# Patient Record
Sex: Female | Born: 1995 | Race: White | Hispanic: No | Smoking: Never smoker
Health system: Southern US, Community
[De-identification: ages and names within clinical notes are randomized; demographics above are authoritative.]

## PROBLEM LIST (undated history)

## (undated) DIAGNOSIS — Z789 Other specified health status: Secondary | ICD-10-CM

## (undated) DIAGNOSIS — O039 Complete or unspecified spontaneous abortion without complication: Secondary | ICD-10-CM

## (undated) HISTORY — DX: Complete or unspecified spontaneous abortion without complication: O03.9

## (undated) HISTORY — PX: INDUCED ABORTION: SHX677

---
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
## 2017-10-18 ENCOUNTER — Other Ambulatory Visit: Payer: Self-pay

## 2017-10-18 ENCOUNTER — Encounter: Payer: Self-pay | Admitting: Emergency Medicine

## 2017-10-18 ENCOUNTER — Ambulatory Visit
Admission: EM | Admit: 2017-10-18 | Discharge: 2017-10-18 | Disposition: A | Discharge status: Home / Self Care | Payer: Self-pay | Attending: Family Medicine | Admitting: Family Medicine

## 2017-10-18 DIAGNOSIS — R109 Unspecified abdominal pain: Secondary | ICD-10-CM

## 2017-10-18 LAB — WET PREP, GENITAL
Clue Cells Wet Prep HPF POC: NONE SEEN
Sperm: NONE SEEN
TRICH WET PREP: NONE SEEN
YEAST WET PREP: NONE SEEN

## 2017-10-18 LAB — URINALYSIS, COMPLETE (UACMP) WITH MICROSCOPIC
BILIRUBIN URINE: NEGATIVE
Bacteria, UA: NONE SEEN
Bilirubin Urine: NEGATIVE
Glucose, UA: NEGATIVE mg/dL
Glucose, UA: NEGATIVE mg/dL
KETONES UR: 15 mg/dL — AB
Ketones, ur: NEGATIVE mg/dL
NITRITE: NEGATIVE
Nitrite: NEGATIVE
Protein, ur: 30 mg/dL — AB
Protein, ur: NEGATIVE mg/dL
Specific Gravity, Urine: 1.02 (ref 1.005–1.030)
Specific Gravity, Urine: <1.005 — ABNORMAL LOW (ref 1.005–1.030)
pH: 6 (ref 5.0–8.0)
pH: 7 (ref 5.0–8.0)

## 2017-10-18 MED ORDER — TAMSULOSIN HCL 0.4 MG PO CAPS: 0.4000 mg | ORAL_CAPSULE | Freq: Every morning | ORAL | 0 refills | Status: DC

## 2017-10-18 MED ORDER — KETOROLAC TROMETHAMINE 60 MG/2ML IM SOLN: 60.0000 mg | Freq: Once | INTRAMUSCULAR | Status: DC

## 2017-10-18 MED ORDER — KETOROLAC TROMETHAMINE 60 MG/2ML IM SOLN
60.0000 mg | Freq: Once | INTRAMUSCULAR | Status: AC
  Administered 2017-10-18: 60 mg via INTRAMUSCULAR

## 2017-10-18 MED ORDER — SULFAMETHOXAZOLE-TRIMETHOPRIM 800-160 MG PO TABS: 1.0000 | ORAL_TABLET | Freq: Two times a day (BID) | ORAL | 0 refills | Status: DC

## 2017-10-18 NOTE — ED Triage Notes (Addendum)
Patient in today c/o 2 day history of urinary frequency, right sided flank pain and malodorous urine. Patient had temp of 99.7 yesterday. Patient does state that she does have some vaginal discharge but that is normal for her.  Patient also complaining of heart burn.

## 2017-10-18 NOTE — ED Provider Notes (Addendum)
[facility]    CSN: [id number] Arrival date & time: 10/18/17  1851     History   Chief Complaint Chief Complaint  Patient presents with  . Urinary Frequency  . Flank Pain    HPI Audrey Cruz is a 22 y.o. female.   HPI  22 year old female presents today with a 2 to 3-day history of urinary frequency urgency and and insatiety but without dysuria.  She also has noticed a right-sided flank pain and malodorous urine.  Yesterday she had a fever of 99.7.  Today she is afebrile but her pulse rate is 114.  Has complained of shortness of breath her O2 sats are 99%.  States that it hurts to cough and she is trying to just "clear her throat."  Has taken no long car trips.  She does not use birth control pills.  Has never smoked.        History reviewed. No pertinent past medical history.  There are no active problems to display for this patient.   History reviewed. No pertinent surgical history.  OB History   None      Home Medications    Prior to Admission medications   Medication Sig Start Date End Date Taking? Authorizing Provider  sulfamethoxazole-trimethoprim (BACTRIM DS,SEPTRA DS) 800-160 MG tablet Take 1 tablet by mouth 2 (two) times daily. 10/18/17   Lutricia Feil, PA-C  tamsulosin (FLOMAX) 0.4 MG CAPS capsule Take 1 capsule (0.4 mg total) by mouth every morning. 10/18/17   Lutricia Feil, PA-C    Family History Family History  Problem Relation Age of Onset  . Thyroid disease Mother   . Hepatitis C Mother   . Healthy Father     Social History Social History   Tobacco Use  . Smoking status: Never Smoker  . Smokeless tobacco: Never Used  Substance Use Topics  . Alcohol use: No  . Drug use: Yes    Types: Marijuana    Comment: last used ~ 1 month ago     Allergies   Amoxicillin and Penicillins   Review of Systems Review of Systems  Constitutional: Positive for activity change and chills. Negative for fatigue and fever.    Respiratory: Positive for shortness of breath.   Cardiovascular: Positive for chest pain.  Genitourinary: Positive for flank pain, frequency and urgency. Negative for dysuria.  All other systems reviewed and are negative.    Physical Exam Triage Vital Signs ED Triage Vitals  Enc Vitals Group     BP 10/18/17 1900 121/76     Pulse Rate 10/18/17 1900 (!) 114     Resp 10/18/17 1900 16     Temp 10/18/17 1900 98.7 F (37.1 C)     Temp Source 10/18/17 1900 Oral     SpO2 10/18/17 1900 99 %     Weight 10/18/17 1901 135 lb (61.2 kg)     Height 10/18/17 1901 5\' 4"  (1.626 m)     Head Circumference --      Peak Flow --      Pain Score 10/18/17 1901 7     Pain Loc --      Pain Edu? --      Excl. in GC? --    No data found.  Updated Vital Signs BP 121/76 (BP Location: Left Arm)   Pulse (!) 114   Temp 98.7 F (37.1 C) (Oral)   Resp 16   Ht 5\' 4"  (1.626 m)   Wt 135 lb (61.2  kg)   LMP 09/27/2017 (Approximate)   SpO2 99%   BMI 23.17 kg/m   Visual Acuity Right Eye Distance:   Left Eye Distance:   Bilateral Distance:    Right Eye Near:   Left Eye Near:    Bilateral Near:     Physical Exam  Constitutional: She is oriented to person, place, and time. She appears well-developed and well-nourished. No distress.  HENT:  Head: Normocephalic.  Eyes: Pupils are equal, round, and reactive to light. Right eye exhibits no discharge. Left eye exhibits no discharge.  Neck: Normal range of motion.  Pulmonary/Chest: Effort normal and breath sounds normal.  Abdominal: Soft. Bowel sounds are normal.  CVA tenderness on the right  Musculoskeletal: Normal range of motion. She exhibits no tenderness.  She has no tenderness to palpation of the ribs or of the paraspinous muscles in the lower thoracic and lumbar regions.  No calf warmth redness or tenderness.  There is no swelling present.  Neurological: She is alert and oriented to person, place, and time.  Skin: Skin is warm and dry. She is not  diaphoretic.  Psychiatric: She has a normal mood and affect. Her behavior is normal. Judgment and thought content normal.  Nursing note and vitals reviewed.    UC Treatments / Results  Labs (all labs ordered are listed, but only abnormal results are displayed) Labs Reviewed  WET PREP, GENITAL - Abnormal; Notable for the following components:      Result Value   WBC, Wet Prep HPF POC FEW (*)    All other components within normal limits  URINALYSIS, COMPLETE (UACMP) WITH MICROSCOPIC - Abnormal; Notable for the following components:   APPearance CLOUDY (*)    Hgb urine dipstick MODERATE (*)    Ketones, ur 15 (*)    Protein, ur 30 (*)    Leukocytes, UA SMALL (*)    Bacteria, UA MANY (*)    All other components within normal limits  URINALYSIS, COMPLETE (UACMP) WITH MICROSCOPIC - Abnormal; Notable for the following components:   Specific Gravity, Urine <1.005 (*)    Hgb urine dipstick SMALL (*)    Leukocytes, UA TRACE (*)    All other components within normal limits  URINE CULTURE    EKG None  Radiology No results found.  Procedures Patient was unable to give [place] an adequate urine sample after 2 attempts and repeated instructions.  Samples were extremely dirty.  Because of this is a in and out catheterization was felt to be her best option.  The procedure was presented to the patient in detail.  After informed consent, in and out catheterization was achieved without difficulty  Medications Ordered in UC Medications  ketorolac (TORADOL) injection 60 mg (60 mg Intramuscular Given 10/18/17 2026)    Initial Impression / Assessment and Plan / UC Course  I have reviewed the triage vital signs and the nursing notes.  Pertinent labs & imaging results that were available during my care of the patient were reviewed by me and considered in my medical decision making (see chart for details).     Urine sample after catheterization did not show significant white cells but did show red  blood cells .  Having a small amount of red blood cells which was suggestive of a possible kidney stone.  Fits her pain pattern and presentation today as well as physical exam.  We will place her on Septra DS as well as Flomax.  Did receive a injection of Toradol for pain.  Home I recommended that she take ibuprofen 400 mg along with Tylenol 500 mg for pain control.  She worsens she should go immediately to the emergency room.  So if she is not improving in a couple of days she should also go to the emergency room or return to our clinic. Final Clinical Impressions(s) / UC Diagnoses   Final diagnoses:  Right flank pain     Discharge Instructions     Plenty of fluids.  Strain all of your urine for stone.  If your pain worsens go to the emergency room.  If you are not improved in 2 to 3 days you should follow-up with your primary care physician our clinic or go to the emergency room.  Pain recommend 400 mg of ibuprofen combined with Tylenol 500 mg every 6 hours as necessary    ED Prescriptions    Medication Sig Dispense Auth. Provider   sulfamethoxazole-trimethoprim (BACTRIM DS,SEPTRA DS) 800-160 MG tablet Take 1 tablet by mouth 2 (two) times daily. 14 tablet Ovid Curd P, PA-C   tamsulosin (FLOMAX) 0.4 MG CAPS capsule Take 1 capsule (0.4 mg total) by mouth every morning. 30 capsule Lutricia Feil, PA-C     Controlled Substance Prescriptions [place] Controlled Substance Registry consulted? Not Applicable   Lutricia Feil, PA-C 10/18/17 2037    Lutricia Feil, PA-C 10/18/17 2042

## 2017-10-18 NOTE — Discharge Instructions (Signed)
Plenty of fluids.  Strain all of your urine for stone.  If your pain worsens go to the emergency room.  If you are not improved in 2 to 3 days you should follow-up with your primary care physician our clinic or go to the emergency room.  Pain recommend 400 mg of ibuprofen combined with Tylenol 500 mg every 6 hours as necessary

## 2017-10-20 LAB — URINE CULTURE: Culture: <10000 — AB

## 2018-02-08 ENCOUNTER — Other Ambulatory Visit: Payer: Self-pay

## 2018-02-08 ENCOUNTER — Encounter: Payer: Self-pay | Admitting: Emergency Medicine

## 2018-02-08 ENCOUNTER — Ambulatory Visit
Admission: EM | Admit: 2018-02-08 | Discharge: 2018-02-08 | Disposition: A | Discharge status: Home / Self Care | Payer: Self-pay | Attending: Family Medicine | Admitting: Family Medicine

## 2018-02-08 ENCOUNTER — Ambulatory Visit (INDEPENDENT_AMBULATORY_CARE_PROVIDER_SITE_OTHER): Payer: Self-pay

## 2018-02-08 DIAGNOSIS — J069 Acute upper respiratory infection, unspecified: Secondary | ICD-10-CM

## 2018-02-08 DIAGNOSIS — F17290 Nicotine dependence, other tobacco product, uncomplicated: Secondary | ICD-10-CM

## 2018-02-08 DIAGNOSIS — B9789 Other viral agents as the cause of diseases classified elsewhere: Secondary | ICD-10-CM

## 2018-02-08 DIAGNOSIS — R0989 Other specified symptoms and signs involving the circulatory and respiratory systems: Secondary | ICD-10-CM

## 2018-02-08 DIAGNOSIS — R05 Cough: Secondary | ICD-10-CM

## 2018-02-08 MED ORDER — HYDROCOD POLST-CPM POLST ER 10-8 MG/5ML PO SUER: 5.0000 mL | Freq: Every evening | ORAL | 0 refills | Status: DC | PRN

## 2018-02-08 MED ORDER — BENZONATATE 100 MG PO CAPS: 100.0000 mg | ORAL_CAPSULE | Freq: Three times a day (TID) | ORAL | 0 refills | Status: DC | PRN

## 2018-02-08 NOTE — ED Triage Notes (Signed)
Patient c/o cough and chest congestion and runny nose for the past 2 days.

## 2018-02-08 NOTE — ED Provider Notes (Addendum)
[facility] ____________________________________________  Time seen: Approximately 7:46 PM  I have reviewed the triage vital signs and the nursing notes.   HISTORY  Chief Complaint Cough and Nasal Congestion  HPI Audrey Cruz is a 23 y.o. female presenting for evaluation of cough and chest congestion for the last 5 days.  States cough is mostly nonproductive, occasionally productive of whitish phlegm.  No hemoptysis.  States nasal drainage.  Denies sore throat.  States tried multiple over-the-counter cough and congestion agents without resolution.  Denies fever, body aches or chills.  Denies shortness of breath.  Does report she has had occasional soreness in her chest. Denies any chest pain at this time. Has overall continue remain active.  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  Denies home sick contacts. Has continued to overall eat and drink well. Denies other aggravating alleviating factors.  Denies immobilization.  Mickie Bail, MD: PCP Patient's last menstrual period was 01/18/2018 (approximate).  Denies pregnancy   History reviewed. No pertinent past medical history.  There are no active problems to display for this patient.   History reviewed. No pertinent surgical history.   No current facility-administered medications for this encounter.   Current Outpatient Medications:  .  benzonatate (TESSALON PERLES) 100 MG capsule, Take 1 capsule (100 mg total) by mouth 3 (three) times daily as needed for cough., Disp: 15 capsule, Rfl: 0 .  chlorpheniramine-HYDROcodone (TUSSIONEX PENNKINETIC ER) 10-8 MG/5ML SUER, Take 5 mLs by mouth at bedtime as needed for cough. do not drive or operate machinery while taking as can cause drowsiness., Disp: 50 mL, Rfl: 0  Allergies Amoxicillin and Penicillins  Family History  Problem Relation Age of Onset  . Thyroid disease Mother   . Hepatitis C Mother   . Healthy Father      Social History Social History   Tobacco Use  . Smoking status: Never Smoker  . Smokeless tobacco: Never Used  Substance Use Topics  . Alcohol use: No  . Drug use: Yes    Types: Marijuana    Comment: last used ~ 1 month ago    Review of Systems Constitutional: No fever/chills ENT: No sore throat.  Positive nasal congestion. Cardiovascular: Denies current chest pain. Respiratory: Denies shortness of breath. Gastrointestinal: No abdominal pain.  No nausea, no vomiting.  No diarrhea.   Musculoskeletal: Negative for back pain. Skin: Negative for rash.   ____________________________________________   PHYSICAL EXAM:  VITAL SIGNS: ED Triage Vitals  Enc Vitals Group     BP 02/08/18 1807 109/77     Pulse Rate 02/08/18 1807 88     Resp 02/08/18 1807 16     Temp 02/08/18 1807 99 F (37.2 C)     Temp Source 02/08/18 1807 Oral     SpO2 02/08/18 1807 99 %     Weight 02/08/18 1805 135 lb (61.2 kg)     Height 02/08/18 1805 5\' 4"  (1.626 m)     Head Circumference --      Peak Flow --      Pain Score 02/08/18 1805 1     Pain Loc --      Pain Edu? --      Excl. in GC? --    Constitutional: Alert and oriented. Well appearing and in no acute distress. Eyes: Conjunctivae are normal.  Head: Atraumatic. No sinus tenderness to palpation. No swelling. No erythema.  Ears: no erythema, normal TMs  bilaterally.   Nose:Nasal congestion  Mouth/Throat: Mucous membranes are moist. No pharyngeal erythema. No tonsillar swelling or exudate.  Neck: No stridor.  No cervical spine tenderness to palpation. Hematological/Lymphatic/Immunilogical: No cervical lymphadenopathy. Cardiovascular: Normal rate, regular rhythm. Grossly normal heart sounds.  Good peripheral circulation. Respiratory: Normal respiratory effort.  No retractions. No wheezes, rales or rhonchi. Good air movement.  Dry intermittent cough noted in room. Musculoskeletal: Ambulatory with steady gait. No cervical, thoracic or lumbar  tenderness to palpation.  No lower extremity edema noted bilaterally. Neurologic:  Normal speech and language. No gait instability. Skin:  Skin appears warm, dry and intact. No rash noted. Psychiatric: Mood and affect are normal. Speech and behavior are normal. ___________________________________________   LABS (all labs ordered are listed, but only abnormal results are displayed)  Labs Reviewed - No data to display  RADIOLOGY  Dg Chest 2 View  Result Date: 02/08/2018 CLINICAL DATA:  Cough and congestion for 3 days. EXAM: CHEST - 2 VIEW COMPARISON:  None available for comparison at time of study interpretation. FINDINGS: Cardiomediastinal silhouette is normal. No pleural effusions or focal consolidations. Trachea projects midline and there is no pneumothorax. Soft tissue planes and included osseous structures are non-suspicious. IMPRESSION: Normal chest. Electronically Signed   By: Awilda Metroourtnay  Bloomer M.D.   On: 02/08/2018 19:01   ____________________________________________   PROCEDURES Procedures    INITIAL IMPRESSION / ASSESSMENT AND PLAN / ED COURSE  Pertinent labs & imaging results that were available during my care of the patient were reviewed by me and considered in my medical decision making (see chart for details).  Well-appearing patient.  No acute distress.  Suspect viral illness.  With patient's report history of vaping, will evaluate chest x-ray.  Counseled to continue cessation.  Denies any current chest discomfort.  Encourage supportive care.  Chest x-ray as above per radiologist, reviewed by myself, negative.  Will treat supportively with PRN Tessalon Perles and PRN Tussionex, over-the-counter Mucinex.  Rest, fluids.  Work note given for today and tomorrow.  Follow-up with primary care week.Discussed indication, risks and benefits of medications with patient.  Discussed follow up with Primary care physician this week. Discussed follow up and return parameters including no  resolution or any worsening concerns. Patient verbalized understanding and agreed to plan.   ____________________________________________   FINAL CLINICAL IMPRESSION(S) / ED DIAGNOSES  Final diagnoses:  Viral URI with cough     ED Discharge Orders         Ordered    chlorpheniramine-HYDROcodone (TUSSIONEX PENNKINETIC ER) 10-8 MG/5ML SUER  At bedtime PRN     02/08/18 1910    benzonatate (TESSALON PERLES) 100 MG capsule  3 times daily PRN     02/08/18 1910           Note: This dictation was prepared with Dragon dictation along with smaller phrase technology. Any transcriptional errors that result from this process are unintentional.        Renford DillsMiller, Tallie Dodds, NP 02/08/18 1954

## 2018-02-08 NOTE — Discharge Instructions (Addendum)
Take medication as prescribed. Rest. Drink plenty of fluids.  ° °Follow up with your primary care physician this week as needed. Return to Urgent care for new or worsening concerns.  ° °

## 2019-08-04 ENCOUNTER — Emergency Department
Admission: EM | Admit: 2019-08-04 | Discharge: 2019-08-04 | Disposition: A | Discharge status: Home / Self Care | Payer: Self-pay | Attending: Emergency Medicine | Admitting: Emergency Medicine

## 2019-08-04 ENCOUNTER — Encounter: Payer: Self-pay | Admitting: Emergency Medicine

## 2019-08-04 ENCOUNTER — Other Ambulatory Visit: Payer: Self-pay

## 2019-08-04 DIAGNOSIS — R55 Syncope and collapse: Secondary | ICD-10-CM | POA: Insufficient documentation

## 2019-08-04 DIAGNOSIS — Z20822 Contact with and (suspected) exposure to covid-19: Secondary | ICD-10-CM | POA: Insufficient documentation

## 2019-08-04 LAB — CBC
HCT: 38.5 % (ref 36.0–46.0)
Hemoglobin: 13 g/dL (ref 12.0–15.0)
MCH: 29.3 pg (ref 26.0–34.0)
MCHC: 33.8 g/dL (ref 30.0–36.0)
MCV: 86.9 fL (ref 80.0–100.0)
Platelets: 273 10*3/uL (ref 150–400)
RBC: 4.43 MIL/uL (ref 3.87–5.11)
RDW: 13.6 % (ref 11.5–15.5)
WBC: 18.8 10*3/uL — ABNORMAL HIGH (ref 4.0–10.5)
nRBC: 0 % (ref 0.0–0.2)

## 2019-08-04 LAB — URINALYSIS, COMPLETE (UACMP) WITH MICROSCOPIC
Bilirubin Urine: NEGATIVE
Glucose, UA: NEGATIVE mg/dL
Ketones, ur: NEGATIVE mg/dL
Leukocytes,Ua: NEGATIVE
Nitrite: NEGATIVE
Protein, ur: NEGATIVE mg/dL
Specific Gravity, Urine: 1.003 — ABNORMAL LOW (ref 1.005–1.030)
pH: 6 (ref 5.0–8.0)

## 2019-08-04 LAB — BASIC METABOLIC PANEL
Anion gap: 10 (ref 5–15)
BUN: 9 mg/dL (ref 6–20)
CO2: 23 mmol/L (ref 22–32)
Calcium: 9.2 mg/dL (ref 8.9–10.3)
Chloride: 102 mmol/L (ref 98–111)
Creatinine, Ser: 0.58 mg/dL (ref 0.44–1.00)
GFR calc Af Amer: >60 mL/min (ref >60)
GFR calc non Af Amer: >60 mL/min (ref >60)
Glucose, Bld: 87 mg/dL (ref 70–99)
Potassium: 3.1 mmol/L — ABNORMAL LOW (ref 3.5–5.1)
Sodium: 135 mmol/L (ref 135–145)

## 2019-08-04 LAB — SARS CORONAVIRUS 2 BY RT PCR (HOSPITAL ORDER, PERFORMED IN ~~LOC~~ HOSPITAL LAB): SARS Coronavirus 2: NEGATIVE

## 2019-08-04 NOTE — ED Provider Notes (Signed)
[facility] Emergency Department Provider Note  Time seen: 4:50 PM  I have reviewed the triage vital signs and the nursing notes.   HISTORY  Chief Complaint Loss of Consciousness   HPI Audrey Cruz is a 24 y.o. female approximately [redacted] weeks pregnant no other past medical history presents emergency department after syncopal event yesterday.  According to the patient while she was waiting in line to check out at the store she became lightheaded and sweaty.  She called her boyfriend however about had a syncopal episode prior to him getting to her.  Patient states her elbow got stuck in the shopping cart so she never fell to the ground.  She was lowered to the ground was told she was passed out for approximately 30 seconds.  Patient states after waking up she felt a little lightheaded but then felt well.  Today he has been feeling normal but was concerned about her pregnancy so she wanted to come to the emergency department to get checked out.  Denies any chest pain or shortness of breath at any point.  No cough.  Patient does state low-grade fever yesterday but none today and has not taken any antipyretics.   History reviewed. No pertinent past medical history.  There are no problems to display for this patient.   History reviewed. No pertinent surgical history.  Prior to Admission medications   Medication Sig Start Date End Date Taking? Authorizing Provider  benzonatate (TESSALON PERLES) 100 MG capsule Take 1 capsule (100 mg total) by mouth 3 (three) times daily as needed for cough. 02/08/18   Renford Dills, NP  chlorpheniramine-HYDROcodone [facility] ER) 10-8 MG/5ML SUER Take 5 mLs by mouth at bedtime as needed for cough. do not drive or operate machinery while taking as can cause drowsiness. 02/08/18   Renford Dills, NP    Allergies  Allergen Reactions  . Amoxicillin Hives  . Penicillins Hives    Family History  Problem Relation Age of Onset  .  Thyroid disease Mother   . Hepatitis C Mother   . Healthy Father     Social History Social History   Tobacco Use  . Smoking status: Never Smoker  . Smokeless tobacco: Never Used  Vaping Use  . Vaping Use: Former  Substance Use Topics  . Alcohol use: No  . Drug use: Yes    Types: Marijuana    Review of Systems Constitutional: Negative for fever. Cardiovascular: Negative for chest pain. Respiratory: Negative for shortness of breath. Gastrointestinal: Negative for abdominal pain Musculoskeletal: Negative for musculoskeletal complaints Neurological: Negative for headache All other ROS negative  ____________________________________________   PHYSICAL EXAM:  VITAL SIGNS: ED Triage Vitals  Enc Vitals Group     BP 08/04/19 1317 126/71     Pulse Rate 08/04/19 1317 92     Resp 08/04/19 1317 18     Temp 08/04/19 1317 98.9 F (37.2 C)     Temp Source 08/04/19 1317 Oral     SpO2 08/04/19 1317 100 %     Weight 08/04/19 1318 120 lb (54.4 kg)     Height 08/04/19 1318 5\' 4"  (1.626 m)     Head Circumference --      Peak Flow --      Pain Score 08/04/19 1316 2     Pain Loc --      Pain Edu? --      Excl. in GC? --    Constitutional: Alert and oriented. Well appearing and  in no distress. Eyes: Normal exam ENT      Head: Normocephalic and atraumatic.      Mouth/Throat: Mucous membranes are moist. Cardiovascular: Normal rate, regular rhythm. No murmur Respiratory: Normal respiratory effort without tachypnea nor retractions. Breath sounds are clear  Gastrointestinal: Soft and nontender. No distention.   Musculoskeletal: Mild tenderness to palpation over a small bruise to the left upper extremity. Neurologic:  Normal speech and language. No gross focal neurologic deficits Skin:  Skin is warm, dry and intact.  Psychiatric: Mood and affect are normal.   ____________________________________________    EKG  EKG viewed and interpreted by myself shows a normal sinus rhythm at  77 bpm with a narrow QRS, normal axis, largely normal intervals, nonspecific ST changes.  ____________________________________________   INITIAL IMPRESSION / ASSESSMENT AND PLAN / ED COURSE  Pertinent labs & imaging results that were available during my care of the patient were reviewed by me and considered in my medical decision making (see chart for details).   Patient presents emergency department for syncopal event yesterday.  Patient states she feels well today.  No complaints besides some mild left arm discomfort where she hit the shopping cart.  Patient does have a small bruise to the left upper extremity.  Great range of motion in all joints, no hematoma.  Bedside ultrasound shows a fetal heart rate of 165 bpm.  Good fetal movement.  Patient's labs does show a moderate leukocytosis, patient denies any infectious symptoms such as dysuria vaginal bleeding fluid leakage cough or shortness of breath.  Given the patient's report of a low-grade temperature yesterday we will check a Covid swab as a precaution.  Patient agreeable to plan of care.  Otherwise the patient appears well we will discharge home with OB follow-up at [facility].  [name] was evaluated in Emergency Department on 08/04/2019 for the symptoms described in the history of present illness. She was evaluated in the context of the global COVID-19 pandemic, which necessitated consideration that the patient might be at risk for infection with the SARS-CoV-2 virus that causes COVID-19. Institutional protocols and algorithms that pertain to the evaluation of patients at risk for COVID-19 are in a state of rapid change based on information released by regulatory bodies including the CDC and federal and state organizations. These policies and algorithms were followed during the patient's care in the ED.  ____________________________________________   FINAL CLINICAL IMPRESSION(S) / ED DIAGNOSES  Syncopal event   Minna Antis, MD 08/04/19 1654

## 2019-08-04 NOTE — ED Notes (Signed)
See triage note, pt reports syncopal episode while shopping yesterday, [redacted] weeks pregnant. Reports left arm injury that got stuck in shopping cart.  Denies syncopal episodes since yesterday. Pt alert and oriented, NAD noted.

## 2019-08-04 NOTE — ED Notes (Signed)
Signature pad not working, pt verbalizes understanding of d/c instructions and when to return to ER, denies questions/concerns at this time

## 2019-08-04 NOTE — Discharge Instructions (Addendum)
Please drink plenty of fluids, eat small meals throughout the day.  Please follow-up with your OB/GYN to inform them of today's ER visit.  Return to the emergency department for any symptoms personally concerning to yourself.

## 2019-08-04 NOTE — ED Triage Notes (Addendum)
Patient to ER for c/o syncopal episode yesterday while at store. Patient states she got her left arm caught in the cart. Patient states she has had near syncopal episodes a couple of times prior to that. Patient is also [redacted] weeks pregnant. Reports slight fever this am (100.0).

## 2019-09-28 IMAGING — CR DG CHEST 2V
2 series · 2 of 2 positions shown · non-contrast
Comparison: None available for comparison at time of study
interpretation.

CLINICAL DATA: Cough and congestion for 3 days.

EXAM:
CHEST - 2 VIEW

[chest pa]
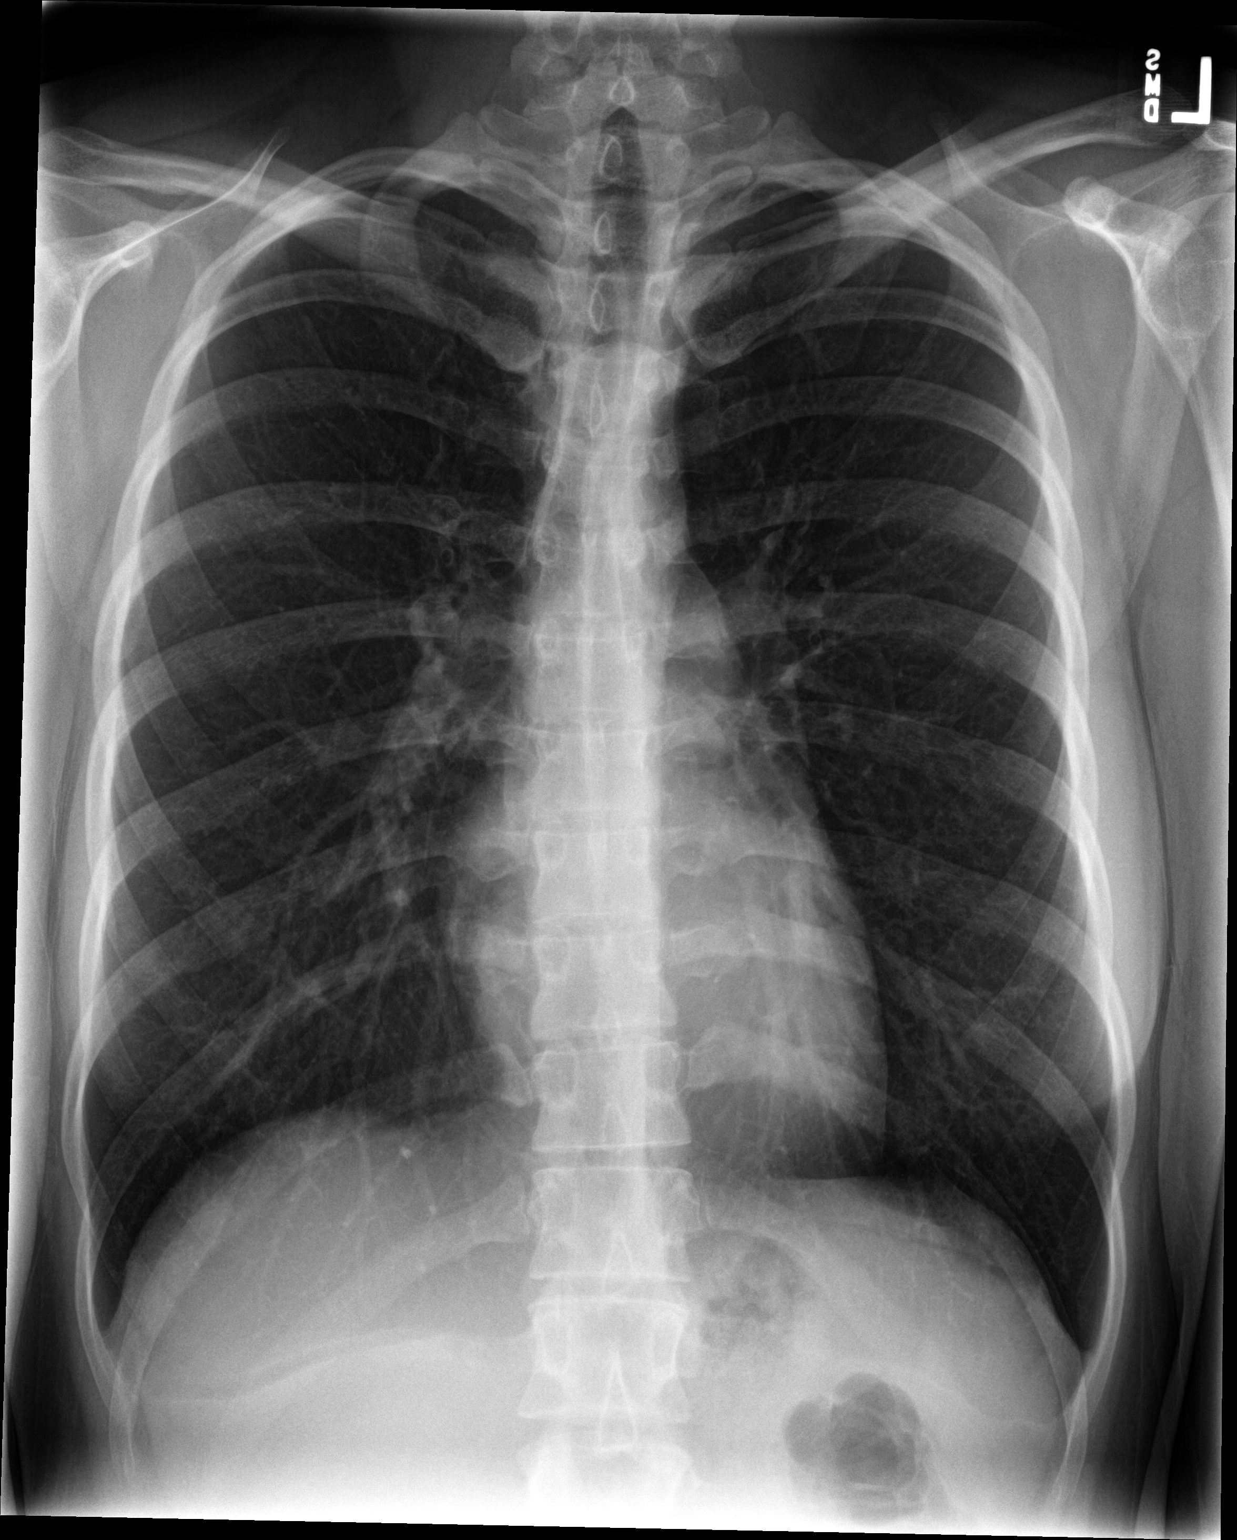

[chest lat]
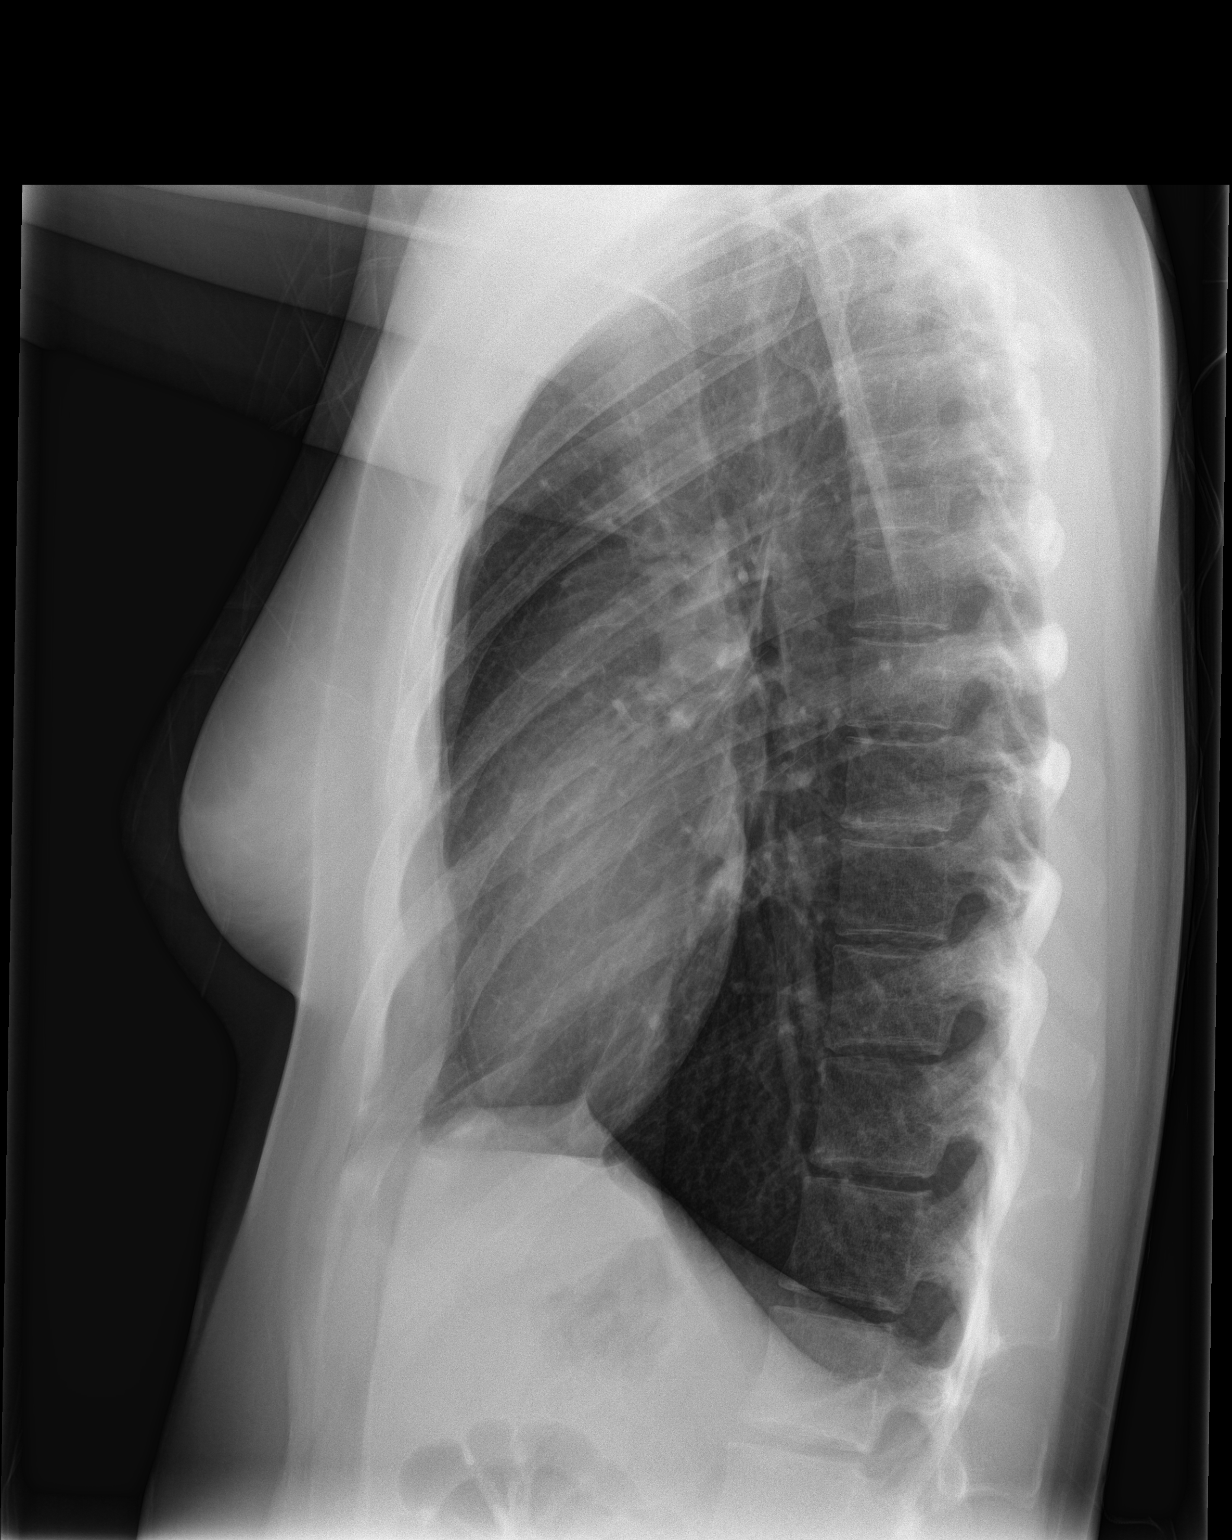

[2 of 2 positions shown; findings below may reference images not displayed]

FINDINGS: Cardiomediastinal silhouette is normal. No pleural effusions or
focal consolidations. Trachea projects midline and there is no
pneumothorax. Soft tissue planes and included osseous structures are
non-suspicious.
IMPRESSION: Normal chest.

## 2019-10-27 ENCOUNTER — Ambulatory Visit
Admission: EM | Admit: 2019-10-27 | Discharge: 2019-10-27 | Disposition: A | Discharge status: Home / Self Care | Payer: Self-pay | Attending: Emergency Medicine | Admitting: Emergency Medicine

## 2019-10-27 ENCOUNTER — Encounter: Payer: Self-pay | Admitting: Emergency Medicine

## 2019-10-27 ENCOUNTER — Other Ambulatory Visit: Payer: Self-pay

## 2019-10-27 DIAGNOSIS — J069 Acute upper respiratory infection, unspecified: Secondary | ICD-10-CM

## 2019-10-27 DIAGNOSIS — Z20822 Contact with and (suspected) exposure to covid-19: Secondary | ICD-10-CM

## 2019-10-27 LAB — GROUP A STREP BY PCR: Group A Strep by PCR: NOT DETECTED

## 2019-10-27 MED ORDER — BENZONATATE 200 MG PO CAPS
200.0000 mg | ORAL_CAPSULE | Freq: Three times a day (TID) | ORAL | 0 refills | Status: DC | PRN
Start: 2019-10-27 — End: 2019-11-12

## 2019-10-27 MED ORDER — FLUTICASONE PROPIONATE 50 MCG/ACT NA SUSP
2.0000 | Freq: Every day | NASAL | 0 refills | Status: DC
Start: 2019-10-27 — End: 2019-11-12

## 2019-10-27 NOTE — ED Provider Notes (Signed)
HPI  SUBJECTIVE:  Audrey Cruz is a 24 y.o. female who presents with 4 days of sore throat, nasal congestion with slight headache, rhinorrhea, mild sinus pressure and cough.  She had a negative Covid PCR test 1 day after the symptoms started.  No fevers, body aches, postnasal drip, loss of sense of smell or taste, shortness of breath, nausea, vomiting, diarrhea, abdominal pain.  No known exposure to strep, Covid, mono, flu.  She did not get the Covid or flu vaccine yet.  Questionable allergy symptoms.  No GERD symptoms.  No drooling, trismus, sensation of throat swelling shut, difficulty breathing, neck stiffness.  No rash.  She is sleeping okay at night without waking up coughing.  No antibiotics in the past month.  No antipyretic in the past 6 hours.  She tried allergy medication which did not help.  She tried Tylenol Sinus and over the counter cough syrup with improvement in her symptoms.  No aggravating factors.  She has a past medical history of seasonal allergies that bother her this time of year.  No history of diabetes, hypertension, pulmonary disease, mono, frequent strep.  LMP: 3 weeks ago.  Denies the possibility of being pregnant.  BUL:AGTXM Nogo, Julious Payer, MD   History reviewed. No pertinent past medical history.  History reviewed. No pertinent surgical history.  Family History  Problem Relation Age of Onset  . Thyroid disease Mother   . Hepatitis C Mother   . Healthy Father     Social History   Tobacco Use  . Smoking status: Never Smoker  . Smokeless tobacco: Never Used  Vaping Use  . Vaping Use: Former  Substance Use Topics  . Alcohol use: No  . Drug use: Yes    Types: Marijuana    No current facility-administered medications for this encounter.  Current Outpatient Medications:  .  benzonatate (TESSALON) 200 MG capsule, Take 1 capsule (200 mg total) by mouth 3 (three) times daily as needed for cough., Disp: 30 capsule, Rfl: 0 .  fluticasone (FLONASE) 50 MCG/ACT nasal  spray, Place 2 sprays into both nostrils daily., Disp: 16 g, Rfl: 0  Allergies  Allergen Reactions  . Amoxicillin Hives  . Penicillins Hives     ROS  As noted in HPI.   Physical Exam  BP 96/68 (BP Location: Right Arm)   Pulse 74   Temp 97.9 F (36.6 C) (Oral)   Resp 18   Ht 5\' 4"  (1.626 m)   Wt 54.4 kg   LMP 10/06/2019   SpO2 100%   Breastfeeding Unknown   BMI 20.60 kg/m   Constitutional: Well developed, well nourished, no acute distress Eyes:  EOMI, conjunctiva normal bilaterally HENT: Normocephalic, atraumatic,mucus membranes moist.  Positive nasal congestion.  No maxillary, frontal sinus tenderness.  Normal oropharynx, normal tonsils with exudates uvula midline.  No cobblestoning, postnasal drip. Neck: No cervical lymphadenopathy Respiratory: Normal inspiratory effort, lungs clear bilaterally Cardiovascular: Normal rate regular rhythm no murmurs rubs or gallops GI: nondistended soft nontender no splenomegaly skin: No rash, skin intact Musculoskeletal: no deformities Neurologic: Alert & oriented x 3, no focal neuro deficits Psychiatric: Speech and behavior appropriate   ED Course   Medications - No data to display  Orders Placed This Encounter  Procedures  . Group A Strep by PCR    Standing Status:   Standing    Number of Occurrences:   1    Order Specific Question:   Patient immune status    Answer:   Normal  .  SARS CORONAVIRUS 2 (TAT 6-24 HRS) Nasopharyngeal Nasopharyngeal Swab    Standing Status:   Standing    Number of Occurrences:   1    Order Specific Question:   Is this test for diagnosis or screening    Answer:   Diagnosis of ill patient    Order Specific Question:   Symptomatic for COVID-19 as defined by CDC    Answer:   Yes    Order Specific Question:   Date of Symptom Onset    Answer:   10/24/2019    Order Specific Question:   Hospitalized for COVID-19    Answer:   No    Order Specific Question:   Admitted to ICU for COVID-19    Answer:    No    Order Specific Question:   Previously tested for COVID-19    Answer:   Yes    Order Specific Question:   Resident in a congregate (group) care setting    Answer:   No    Order Specific Question:   Employed in healthcare setting    Answer:   No    Order Specific Question:   Pregnant    Answer:   No    Order Specific Question:   Has patient completed COVID vaccination(s) (2 doses of Pfizer/Moderna 1 dose of [employer])    Answer:   No    Results for orders placed or performed during the hospital encounter of 10/27/19 (from the past 24 hour(s))  Group A Strep by PCR     Status: None   Collection Time: 10/27/19  3:26 PM   Specimen: Throat; Sterile Swab  Result Value Ref Range   Group A Strep by PCR NOT DETECTED NOT DETECTED   No results found.  ED Clinical Impression  1. Viral URI with cough   2. Encounter for laboratory testing for COVID-19 virus      ED Assessment/Plan  Strep PCR negative.  Covid PCR sent since patient was symptomatic only for 1 day when she had her first test.  Presentation consistent with an upper respiratory infection.  Home with Mucinex D, saline nasal irrigation, Flonase, Tessalon, Benadryl/Maalox mixture.  Follow-up with PMD or here if not better in a week.  Discussed labs,  MDM, treatment plan, and plan for follow-up with patient.  patient agrees with plan.   Meds ordered this encounter  Medications  . fluticasone (FLONASE) 50 MCG/ACT nasal spray    Sig: Place 2 sprays into both nostrils daily.    Dispense:  16 g    Refill:  0  . benzonatate (TESSALON) 200 MG capsule    Sig: Take 1 capsule (200 mg total) by mouth 3 (three) times daily as needed for cough.    Dispense:  30 capsule    Refill:  0    *This clinic note was created using Scientist, clinical (histocompatibility and immunogenetics). Therefore, there may be occasional mistakes despite careful proofreading.   ?    Domenick Gong, MD 10/28/19 [phone]

## 2019-10-27 NOTE — ED Triage Notes (Signed)
Patient c/o cough, nasal congestion and headache that started Friday. Patient states she had a negative COVID test Saturday.

## 2019-10-27 NOTE — Discharge Instructions (Addendum)
Strep PCR was negative.  We will contact you if your Covid test comes back positive.  Will be back in 24 hours.  1 gram of Tylenol and 600 mg ibuprofen together 3-4 times a day as needed for pain.  Make sure you drink plenty of extra fluids.  Some people find salt water gargles and  Traditional Medicinal's "Throat Coat" tea helpful. Take 5 mL of liquid Benadryl and 5 mL of Maalox. Mix it together, and then hold it in your mouth for as long as you can and then swallow. You may do this 4 times a day.    Mucinex D, saline nasal irrigation with a Lloyd Huger med rinse and distilled water as often as you want, Flonase, Tessalon for the cough.  Go to www.goodrx.com to look up your medications. This will give you a list of where you can find your prescriptions at the most affordable prices. Or ask the pharmacist what the cash price is, or if they have any other discount programs available to help make your medication more affordable. This can be less expensive than what you would pay with insurance.

## 2019-10-28 LAB — SARS CORONAVIRUS 2 (TAT 6-24 HRS): SARS Coronavirus 2: NEGATIVE

## 2019-11-12 ENCOUNTER — Other Ambulatory Visit: Payer: Self-pay

## 2019-11-12 ENCOUNTER — Ambulatory Visit
Admission: EM | Admit: 2019-11-12 | Discharge: 2019-11-12 | Disposition: A | Discharge status: Home / Self Care | Payer: Self-pay | Attending: Emergency Medicine | Admitting: Emergency Medicine

## 2019-11-12 DIAGNOSIS — J069 Acute upper respiratory infection, unspecified: Secondary | ICD-10-CM

## 2019-11-12 DIAGNOSIS — H66001 Acute suppurative otitis media without spontaneous rupture of ear drum, right ear: Secondary | ICD-10-CM

## 2019-11-12 MED ORDER — PROMETHAZINE-DM 6.25-15 MG/5ML PO SYRP
5.0000 mL | ORAL_SOLUTION | Freq: Four times a day (QID) | ORAL | 0 refills | Status: DC | PRN
Start: 2019-11-12 — End: 2019-11-17

## 2019-11-12 MED ORDER — BENZONATATE 100 MG PO CAPS
200.0000 mg | ORAL_CAPSULE | Freq: Three times a day (TID) | ORAL | 0 refills | Status: DC
Start: 2019-11-12 — End: 2019-11-17

## 2019-11-12 MED ORDER — DOXYCYCLINE HYCLATE 100 MG PO CAPS
100.0000 mg | ORAL_CAPSULE | Freq: Two times a day (BID) | ORAL | 0 refills | Status: DC
Start: 2019-11-12 — End: 2019-11-17

## 2019-11-12 NOTE — ED Provider Notes (Signed)
[facility]    CSN: [id number] Arrival date & time: 11/12/19  1431      History   Chief Complaint Chief Complaint  Patient presents with   Nasal Congestion    HPI Audrey Cruz is a 24 y.o. female.   24 year old female here for evaluation of 10 to new nasal congestion and cough.  Patient reports that her symptoms for started around 21 October and have continued.  She was evaluated in this urgent care on 22 October and was using Flonase and [employer] at that time.  She had a negative Covid test, negative flu, and negative strep.  Patient denies fever, sore throat, shortness of breath, wheezing, nausea, vomiting, diarrhea, body aches, changes to taste or smell sense, or sick contacts.  Patient reports that her cough has been dry, she has had ear pressure especially on the right, and she reports that this feels a little bit like her allergy symptoms.  Patient is not currently taking any allergy medications.  Patient also has not received her flu vaccine or Covid vaccine at this point.     History reviewed. No pertinent past medical history.  There are no problems to display for this patient.   History reviewed. No pertinent surgical history.  OB History    Gravida  1   Para      Term      Preterm      AB      Living        SAB      TAB      Ectopic      Multiple      Live Births               Home Medications    Prior to Admission medications   Medication Sig Start Date End Date Taking? Authorizing Provider  benzonatate (TESSALON) 100 MG capsule Take 2 capsules (200 mg total) by mouth every 8 (eight) hours. 11/12/19   Margarette Canada, NP  doxycycline (VIBRAMYCIN) 100 MG capsule Take 1 capsule (100 mg total) by mouth 2 (two) times daily. 11/12/19   Margarette Canada, NP  promethazine-dextromethorphan (PROMETHAZINE-DM) 6.25-15 MG/5ML syrup Take 5 mLs by mouth 4 (four) times daily as needed. 11/12/19   Margarette Canada, NP  fluticasone (FLONASE)  50 MCG/ACT nasal spray Place 2 sprays into both nostrils daily. 10/27/19 11/12/19  Melynda Ripple, MD    Family History Family History  Problem Relation Age of Onset   Thyroid disease Mother    Hepatitis C Mother    Healthy Father     Social History Social History   Tobacco Use   Smoking status: Never Smoker   Smokeless tobacco: Never Used  Scientific laboratory technician Use: Former  Substance Use Topics   Alcohol use: No   Drug use: Yes    Types: Marijuana     Allergies   Amoxicillin and Penicillins   Review of Systems Review of Systems  Constitutional: Negative for activity change, appetite change and fever.  HENT: Positive for congestion, ear pain, postnasal drip and rhinorrhea. Negative for sinus pressure and sore throat.   Respiratory: Positive for cough. Negative for shortness of breath and wheezing.   Cardiovascular: Negative for chest pain.  Gastrointestinal: Negative for abdominal pain, nausea and vomiting.  Musculoskeletal: Negative for arthralgias and myalgias.  Skin: Negative for rash.  Neurological: Negative for syncope and headaches.  Hematological: Negative.   Psychiatric/Behavioral: Negative.      Physical Exam  Triage Vital Signs ED Triage Vitals  Enc Vitals Group     BP 11/12/19 1522 105/62     Pulse Rate 11/12/19 1522 99     Resp 11/12/19 1522 18     Temp 11/12/19 1522 98.2 F (36.8 C)     Temp Source 11/12/19 1522 Oral     SpO2 11/12/19 1522 98 %     Weight 11/12/19 1521 120 lb (54.4 kg)     Height 11/12/19 1521 _0  (1.626 m)     Head Circumference --      Peak Flow --      Pain Score 11/12/19 1521 0     Pain Loc --      Pain Edu? --      Excl. in [place]? --    No data found.  Updated Vital Signs BP 105/62 (BP Location: Right Arm)    Pulse 99    Temp 98.2 F (36.8 C) (Oral)    Resp 18    Ht _1  (1.626 m)    Wt 120 lb (54.4 kg)    LMP 10/29/2019    SpO2 98%    Breastfeeding No    BMI 20.60 kg/m   Visual Acuity Right Eye  Distance:   Left Eye Distance:   Bilateral Distance:    Right Eye Near:   Left Eye Near:    Bilateral Near:     Physical Exam Vitals and nursing note reviewed.  Constitutional:      General: She is not in acute distress.    Appearance: Normal appearance. She is normal weight. She is not toxic-appearing.  HENT:     Head: Normocephalic and atraumatic.     Right Ear: Ear canal and external ear normal. There is no impacted cerumen.     Left Ear: Tympanic membrane, ear canal and external ear normal.     Ears:     Comments: Right tympanic membrane is markedly erythematous with a loss of landmarks.  No injection noted.    Nose: Congestion and rhinorrhea present.     Comments: Nasal mucosa has marked erythema and edema with clear nasal discharge.    Mouth/Throat:     Mouth: Mucous membranes are moist.     Pharynx: Oropharynx is clear. Posterior oropharyngeal erythema present. No oropharyngeal exudate.     Comments: Your oropharynx has erythema and clear postnasal drip.  Tonsillar pillars are unremarkable. Eyes:     General: No scleral icterus.       Right eye: No discharge.        Left eye: No discharge.     Extraocular Movements: Extraocular movements intact.     Conjunctiva/sclera: Conjunctivae normal.     Pupils: Pupils are equal, round, and reactive to light.     Comments: Patient has allergic shiners beneath both eyes.  Cardiovascular:     Rate and Rhythm: Normal rate and regular rhythm.     Pulses: Normal pulses.     Heart sounds: Normal heart sounds. No murmur heard.  No gallop.   Pulmonary:     Effort: Pulmonary effort is normal.     Breath sounds: Normal breath sounds. No wheezing, rhonchi or rales.  Musculoskeletal:        General: No swelling or tenderness. Normal range of motion.     Cervical back: Normal range of motion and neck supple.  Lymphadenopathy:     Cervical: No cervical adenopathy.  Skin:    General: Skin is warm and dry.  Capillary Refill: Capillary  refill takes less than 2 seconds.     Findings: No erythema or rash.  Neurological:     General: No focal deficit present.     Mental Status: She is alert and oriented to person, place, and time.  Psychiatric:        Mood and Affect: Mood normal.        Behavior: Behavior normal.        Thought Content: Thought content normal.        Judgment: Judgment normal.      UC Treatments / Results  Labs (all labs ordered are listed, but only abnormal results are displayed) Labs Reviewed - No data to display  EKG   Radiology No results found.  Procedures Procedures (including critical care time)  Medications Ordered in UC Medications - No data to display  Initial Impression / Assessment and Plan / UC Course  I have reviewed the triage vital signs and the nursing notes.  Pertinent labs & imaging results that were available during my care of the patient were reviewed by me and considered in my medical decision making (see chart for details).   Patient is here for evaluation of continued nasal congestion and cough x3 weeks.  She reports that some of the symptoms seem like her allergies but she is not actively taking anything for her allergies.  She says that she has a lot of drainage and is always having to clear her throat and "Lugie all the time".  She also reports that she had a lot of pain and pressure in her right ear and had to pop it this morning which caused a lot of pain.  Physical exam reveals marked erythema and edema of nasal mucosa.  Right TM is erythematous with loss of landmarks.  Exam is consistent with right suppurative otitis media.  Respiratory exam is benign.  Will treat her otitis media with doxycycline twice daily x10 days.  Will give patient Tessalon Perles and Promethazine DM for cough and congestion.  Discussed having patient resume her Flonase and start taking Allegra 180 mg daily.  Also discussed sinus irrigation with patient.    Final Clinical Impressions(s) /  UC Diagnoses   Final diagnoses:  Viral upper respiratory tract infection  Non-recurrent acute suppurative otitis media of right ear without spontaneous rupture of tympanic membrane     Discharge Instructions     Take the doxycycline twice daily, with food, for 10 days.  Use the Tessalon Perles every 8 hours during the day as needed for cough, take them with a small sip of water.  Use the Promethazine DM at bedtime for cough and congestion.  Increase your oral fluid intake to keep your mucus nice and thin.  Perform sinus irrigation twice daily with distilled water and a [employer] sinus rinse kit.  Resume using your Flonase, two squirts in each nostril at bedtime, for nasal congestion and runny nose.  Follow each pair of squirts per nostril with one squirt of nasal saline spray.  Start taking Allegra 180 mg daily for allergy symptoms.  If your symptoms continue follow-up with your primary care provider.    ED Prescriptions    Medication Sig Dispense Auth. Provider   benzonatate (TESSALON) 100 MG capsule Take 2 capsules (200 mg total) by mouth every 8 (eight) hours. 21 capsule Margarette Canada, NP   promethazine-dextromethorphan (PROMETHAZINE-DM) 6.25-15 MG/5ML syrup Take 5 mLs by mouth 4 (four) times daily as needed. 118 mL Margarette Canada, NP  doxycycline (VIBRAMYCIN) 100 MG capsule Take 1 capsule (100 mg total) by mouth 2 (two) times daily. 20 capsule Margarette Canada, NP     PDMP not reviewed this encounter.   Margarette Canada, NP 11/12/19 1600

## 2019-11-12 NOTE — Discharge Instructions (Signed)
Take the doxycycline twice daily, with food, for 10 days.  Use the Tessalon Perles every 8 hours during the day as needed for cough, take them with a small sip of water.  Use the Promethazine DM at bedtime for cough and congestion.  Increase your oral fluid intake to keep your mucus nice and thin.  Perform sinus irrigation twice daily with distilled water and a [employer] sinus rinse kit.  Resume using your Flonase, two squirts in each nostril at bedtime, for nasal congestion and runny nose.  Follow each pair of squirts per nostril with one squirt of nasal saline spray.  Start taking Allegra 180 mg daily for allergy symptoms.  If your symptoms continue follow-up with your primary care provider.

## 2019-11-12 NOTE — ED Triage Notes (Signed)
Patient complains of nasal congestion and cough that has been ongoing since 10/23. States that she completed the tessalon perles and flonase but is still not feeling better.

## 2019-11-17 ENCOUNTER — Ambulatory Visit
Admission: RE | Admit: 2019-11-17 | Discharge: 2019-11-17 | Disposition: A | Discharge status: Home / Self Care | Payer: Self-pay | Source: Ambulatory Visit | Attending: Family Medicine | Admitting: Family Medicine

## 2019-11-17 ENCOUNTER — Ambulatory Visit
Admission: EM | Admit: 2019-11-17 | Discharge: 2019-11-17 | Disposition: A | Discharge status: Home / Self Care | Payer: Self-pay | Attending: Family Medicine | Admitting: Family Medicine

## 2019-11-17 ENCOUNTER — Other Ambulatory Visit: Payer: Self-pay

## 2019-11-17 ENCOUNTER — Telehealth: Payer: Self-pay | Admitting: Family Medicine

## 2019-11-17 DIAGNOSIS — N939 Abnormal uterine and vaginal bleeding, unspecified: Secondary | ICD-10-CM

## 2019-11-17 LAB — HCG, QUANTITATIVE, PREGNANCY: hCG, Beta Chain, Quant, S: <1 m[IU]/mL (ref <5)

## 2019-11-17 MED ORDER — MEDROXYPROGESTERONE ACETATE 10 MG PO TABS
10.0000 mg | ORAL_TABLET | Freq: Every day | ORAL | 0 refills | Status: DC
Start: 2019-11-17 — End: 2019-11-26

## 2019-11-17 NOTE — ED Provider Notes (Signed)
Patient returned at my request for HCG quant given [place] results.  Everlene Other DO [facility]    Tommie Sams, [place] 11/17/19 2002

## 2019-11-17 NOTE — ED Triage Notes (Signed)
Pt presents to Urgent Care with c/o heavy periods and severe cramps since having an elective abortion on 08/07/19. Pt also reports periods are lasting longer than normal. She is currently menstruating and reports noting nickel-size clots.

## 2019-11-17 NOTE — ED Provider Notes (Signed)
[facility]    CSN: [id number] Arrival date & time: 11/17/19  1422      History   Chief Complaint Chief Complaint  Patient presents with  . Menstrual Problem   HPI  24 year old female presents with the above complaint.  Patient states that she has had ongoing heavy menstrual bleeding with associated severe cramps since she had an elective abortion on 8/5.  Patient had a D&C on 8/5 at [facility] in [place].  Patient reports that she has had more frequent periods and heavy bleeding with associated severe cramps since that time.  She is currently on her menstrual cycle.  Patient states that this is not normal for her.  She is not currently on birth control.  She has had no unprotected intercourse since procedure.  No relieving factors.  Pain currently 6/10 in severity.  Past Surgical History:  Procedure Laterality Date  . INDUCED ABORTION      OB History    Gravida  1   Para      Term      Preterm      AB      Living        SAB      TAB      Ectopic      Multiple      Live Births               Home Medications    Prior to Admission medications   Medication Sig Start Date End Date Taking? Authorizing Provider  acetaminophen (TYLENOL) 500 MG tablet Take 500 mg by mouth every 6 (six) hours as needed.   Yes [provider]  fluticasone (FLONASE) 50 MCG/ACT nasal spray Place 1 spray into both nostrils daily.   Yes [provider]    Family History Family History  Problem Relation Age of Onset  . Thyroid disease Mother   . Hepatitis C Mother   . Healthy Father     Social History Social History   Tobacco Use  . Smoking status: Never Smoker  . Smokeless tobacco: Never Used  Vaping Use  . Vaping Use: Former  Substance Use Topics  . Alcohol use: No  . Drug use: Yes    Types: Marijuana     Allergies   Amoxicillin and Penicillins   Review of Systems Review of Systems  Genitourinary: Positive for  menstrual problem.   Physical Exam Triage Vital Signs ED Triage Vitals  Enc Vitals Group     BP 11/17/19 1619 110/64     Pulse Rate 11/17/19 1619 75     Resp 11/17/19 1619 18     Temp 11/17/19 1619 98.5 F (36.9 C)     Temp Source 11/17/19 1619 Oral     SpO2 11/17/19 1619 100 %     Weight 11/17/19 1618 120 lb (54.4 kg)     Height 11/17/19 1618 5' 4.5" (1.638 m)     Head Circumference --      Peak Flow --      Pain Score 11/17/19 1617 6     Pain Loc --      Pain Edu? --      Excl. in GC? --    Updated Vital Signs BP 110/64 (BP Location: Right Arm)   Pulse 75   Temp 98.5 F (36.9 C) (Oral)   Resp 18   Ht 5' 4.5" (1.638 m)   Wt 54.4 kg   LMP 11/12/2019 (Exact Date)  SpO2 100%   BMI 20.28 kg/m   Visual Acuity Right Eye Distance:   Left Eye Distance:   Bilateral Distance:    Right Eye Near:   Left Eye Near:    Bilateral Near:     Physical Exam Constitutional:      General: She is not in acute distress.    Appearance: Normal appearance. She is not ill-appearing.  HENT:     Head: Normocephalic and atraumatic.  Eyes:     General:        Right eye: No discharge.        Left eye: No discharge.     Conjunctiva/sclera: Conjunctivae normal.  Cardiovascular:     Rate and Rhythm: Normal rate and regular rhythm.  Pulmonary:     Effort: Pulmonary effort is normal.     Breath sounds: Normal breath sounds. No wheezing, rhonchi or rales.  Abdominal:     General: There is no distension.     Palpations: Abdomen is soft.     Tenderness: There is no abdominal tenderness.  Neurological:     Mental Status: She is alert.  Psychiatric:        Mood and Affect: Mood normal.        Behavior: Behavior normal.    UC Treatments / Results  Labs (all labs ordered are listed, but only abnormal results are displayed) Labs Reviewed - No data to display  EKG   Radiology US PELVIC COMPLETE WITH TRANSVAGINAL  Result Date: 11/17/2019 CLINICAL DATA:  Heavy menstruation.   Recent abortion. EXAM: TRANSABDOMINAL AND TRANSVAGINAL ULTRASOUND OF PELVIS TECHNIQUE: Both transabdominal and transvaginal ultrasound examinations of the pelvis were performed. Transabdominal technique was performed for global imaging of the pelvis including uterus, ovaries, adnexal regions, and pelvic cul-de-sac. It was necessary to proceed with endovaginal exam following the transabdominal exam to visualize the endometrium and ovaries. COMPARISON:  None FINDINGS: Uterus Measurements: 7.8 x 4.0 x 5.4 cm = volume: 88 mL. No fibroids or other mass visualized. Endometrium Thickness: 9 mm. Heterogeneity of the endometrium is identified within the distal canal. Here, there is a intrauterine fluid collection which measures 0.5 x 0.6 x 0.5 cm. No yolk sac or embryo identified within this fluid collection multiple prominent vessels with increased vascularity are identified localizing to the superior left fundal region. Right ovary Measurements: 3.6 by 1.8 x 3.3 cm = volume: 11.7 mL. Normal appearance/no adnexal mass. Left ovary Measurements: 3.1 x 1.6 x 2.2 cm = volume: 5.6 mL. Normal appearance/no adnexal mass. Other findings No free fluid identified. IMPRESSION: 1. There is a indeterminate focal fluid collection within the distal canal measuring 6 mm in maximum diameter. No yolk sac or embryo identified within this indeterminate structure. Correlation with quantitative beta HCG is recommended. 2. Endometrial heterogeneity with increased vascularity is noted within the distal canal extending into the superior left fundal region. In a patient who is status post recent abortion retained products of conception cannot be excluded. Alternatively, findings may reflect underlying endometritis or focal lesion. If there are no signs or symptoms of infection and there is a negative pregnancy test then further evaluation with sonohysterogram may be helpful to assess for etiology of abnormal uterine bleeding. Electronically Signed    By: Signa Kell M.D.   On: 11/17/2019 19:00    Procedures Procedures (including critical care time)  Medications Ordered in UC Medications - No data to display  Initial Impression / Assessment and Plan / UC Course  I have reviewed the  triage vital signs and the nursing notes.  Pertinent labs & imaging results that were available during my care of the patient were reviewed by me and considered in my medical decision making (see chart for details).    24 year old female presents with abnormal uterine bleeding.  Ultrasound was obtained given fairly recent abortion.  Ultrasound revealed an indeterminate fluid collection.  Also revealed endometrial heterogeneity.  Retained products of conception cannot be excluded.  Possible endometritis.  Patient returns at my request after ultrasound results returned for beta hCG quant. hCG quant less than 1. Etiology remains unclear at this time. Placing on Provera to stop heavy bleeding. Will send to OB/GYN.  Final Clinical Impressions(s) / UC Diagnoses   Final diagnoses:  Abnormal uterine bleeding     Discharge Instructions     I will call with the results.  Take care  Dr. Adriana Simas    ED Prescriptions    None     PDMP not reviewed this encounter.   Tommie Sams, [place] 11/17/19 2045

## 2019-11-17 NOTE — Discharge Instructions (Signed)
I will call OB GYN Tomorrow.  Medication as prescribed.  If you develop fever, go to the ER.  Take care  Dr. Adriana Simas

## 2019-11-17 NOTE — Telephone Encounter (Signed)
Patient returning at my request for HCG given abnormal [place].  Everlene Other DO [facility]

## 2019-11-17 NOTE — Discharge Instructions (Signed)
I will call with the results.  Take care  Dr. Roslynn Holte  

## 2019-11-19 ENCOUNTER — Telehealth: Payer: Self-pay | Admitting: Emergency Medicine

## 2019-11-19 NOTE — Telephone Encounter (Signed)
Contacted [name] and spoke with [name] about an appointment for this patient. Patient is scheduled for 11/18 at 330pm with Dr. Jerene Pitch. Patient made aware of appointment and agreed to pay $117 at the visit as she has no insurance coverage at this time.

## 2019-11-20 ENCOUNTER — Ambulatory Visit (INDEPENDENT_AMBULATORY_CARE_PROVIDER_SITE_OTHER): Payer: Self-pay | Admitting: Obstetrics and Gynecology

## 2019-11-20 ENCOUNTER — Other Ambulatory Visit (HOSPITAL_COMMUNITY)
Admission: RE | Admit: 2019-11-20 | Discharge: 2019-11-20 | Disposition: A | Discharge status: Home / Self Care | Payer: Self-pay | Source: Ambulatory Visit | Attending: Obstetrics and Gynecology | Admitting: Obstetrics and Gynecology

## 2019-11-20 ENCOUNTER — Encounter: Payer: Self-pay | Admitting: Obstetrics and Gynecology

## 2019-11-20 ENCOUNTER — Other Ambulatory Visit: Payer: Self-pay

## 2019-11-20 VITALS — BP 112/70 | Ht 64.0 in | Wt 123.8 lb

## 2019-11-20 DIAGNOSIS — Z113 Encounter for screening for infections with a predominantly sexual mode of transmission: Secondary | ICD-10-CM

## 2019-11-20 DIAGNOSIS — Z124 Encounter for screening for malignant neoplasm of cervix: Secondary | ICD-10-CM

## 2019-11-20 DIAGNOSIS — D62 Acute posthemorrhagic anemia: Secondary | ICD-10-CM

## 2019-11-20 DIAGNOSIS — O034 Incomplete spontaneous abortion without complication: Secondary | ICD-10-CM

## 2019-11-20 NOTE — Progress Notes (Signed)
New Gyn Vaginal bleeding that started 11/12/2019 it has slowed down now.

## 2019-11-21 ENCOUNTER — Telehealth: Payer: Self-pay | Admitting: Obstetrics and Gynecology

## 2019-11-21 LAB — CBC WITH DIFFERENTIAL
Basophils Absolute: 0.1 10*3/uL (ref 0.0–0.2)
Basos: 1 %
EOS (ABSOLUTE): 0.2 10*3/uL (ref 0.0–0.4)
Eos: 1 %
Hematocrit: 29.5 % — ABNORMAL LOW (ref 34.0–46.6)
Hemoglobin: 9.3 g/dL — ABNORMAL LOW (ref 11.1–15.9)
Immature Grans (Abs): 0 10*3/uL (ref 0.0–0.1)
Immature Granulocytes: 0 %
Lymphocytes Absolute: 3.3 10*3/uL — ABNORMAL HIGH (ref 0.7–3.1)
Lymphs: 25 %
MCH: 28.6 pg (ref 26.6–33.0)
MCHC: 31.5 g/dL (ref 31.5–35.7)
MCV: 91 fL (ref 79–97)
Monocytes Absolute: 0.7 10*3/uL (ref 0.1–0.9)
Monocytes: 6 %
Neutrophils Absolute: 8.8 10*3/uL — ABNORMAL HIGH (ref 1.4–7.0)
Neutrophils: 67 %
RBC: 3.25 x10E6/uL — ABNORMAL LOW (ref 3.77–5.28)
RDW: 12.4 % (ref 11.7–15.4)
WBC: 13.1 10*3/uL — ABNORMAL HIGH (ref 3.4–10.8)

## 2019-11-21 NOTE — H&P (View-Only) (Signed)
Patient ID: Audrey Cruz, female   DOB: 1995-12-17, 24 y.o.   MRN: [id number]  Reason for Consult: Gynecologic Exam (VAGINAL BLEEDING )   Referred by Mickie Bail, MD  Subjective:     HPI:  Audrey Cruz is a 24 y.o. female patient presents today for follow-up from the ER for abnormal uterine bleeding.  She reports that she had a termination in August and since that time has had difficulty with heavy bleeding.  She reports that she has been having extended episodes of bleeding since her abortion to have lasted for 1 to 2 weeks.  She reports that she had a surgical abortion and [place] at about [redacted] weeks gestation.  She reports that previously her menstrual cycles were regular monthly and lasted for 4 days.  She does not usually have heavy menstrual cycle but since that termination she has been having gushing and flooding her blood saturating a pad or tampon more frequently than every hour and having accidents where she leaks through her clothes.  She is also having severe pain that has been preventing her from attending work or school.  She reports that most recently she bled from November 10 until now.  She was seen in the ER and the ultrasound was consistent with retained products of conception.  She was discharged on Provera with plans to follow-up with the GYN provider.  She reports that she has not started on birth control after her termination but she does desire to be on birth control.  She reports that she has been using condoms to prevent pregnancy.  She reports that she has been sexually active but not frequently.  She does report that she has been having episodes of feeling dizzy and syncope.   Gynecological History Menarche: 16 Describes periods as generally monthly regular lasting 4 days. Last pap smear: No history of previous Pap smear. History of STDs: Denies history of sexually transmitted infections Sexually Active: Yes uses condoms.  Obstetrical History   G1P0010 2021 voluntary termination of pregnancy [redacted] weeks gestation surgical abortion  No past medical history on file. Family History  Problem Relation Age of Onset  . Thyroid disease Mother   . Hepatitis C Mother   . Healthy Father    Past Surgical History:  Procedure Laterality Date  . INDUCED ABORTION      Short Social History:  Social History   Tobacco Use  . Smoking status: Never Smoker  . Smokeless tobacco: Never Used  Substance Use Topics  . Alcohol use: No    Allergies  Allergen Reactions  . Amoxicillin Hives  . Penicillins Hives    Current Outpatient Medications  Medication Sig Dispense Refill  . acetaminophen (TYLENOL) 500 MG tablet Take 500 mg by mouth every 6 (six) hours as needed.    . fluticasone (FLONASE) 50 MCG/ACT nasal spray Place 1 spray into both nostrils daily.    . medroxyPROGESTERone (PROVERA) 10 MG tablet Take 1 tablet (10 mg total) by mouth daily. 10 tablet 0   No current facility-administered medications for this visit.    Review of Systems  Constitutional: Negative for chills, fatigue, fever and unexpected weight change.  HENT: Negative for trouble swallowing.  Eyes: Negative for loss of vision.  Respiratory: Negative for cough, shortness of breath and wheezing.  Cardiovascular: Negative for chest pain, leg swelling, palpitations and syncope.  GI: Negative for abdominal pain, blood in stool, diarrhea, nausea and vomiting.  GU: Negative for difficulty urinating, dysuria, frequency  and hematuria.  Musculoskeletal: Negative for back pain, leg pain and joint pain.  Skin: Negative for rash.  Neurological: Negative for dizziness, headaches, light-headedness, numbness and seizures.  Psychiatric: Negative for behavioral problem, confusion, depressed mood and sleep disturbance.        Objective:  Objective   Vitals:   11/20/19 1531  BP: 112/70  Weight: 123 lb 12.8 oz (56.2 kg)  Height: 5\' 4"  (1.626 m)   Body mass index is 21.25  kg/m.  Physical Exam Vitals and nursing note reviewed.  Constitutional:      Appearance: She is well-developed.  HENT:     Head: Normocephalic and atraumatic.  Eyes:     Pupils: Pupils are equal, round, and reactive to light.  Cardiovascular:     Rate and Rhythm: Normal rate and regular rhythm.  Pulmonary:     Effort: Pulmonary effort is normal. No respiratory distress.  Genitourinary:    Comments: External: Normal appearing vulva. No lesions noted.  Speculum examination: Normal appearing cervix. Scant blood in the vaginal vault.  Bimanual examination: Uterus midline, non-tender, normal in size, shape and contour.  No CMT. No adnexal masses. No adnexal tenderness. Pelvis not fixed.  Skin:    General: Skin is warm and dry.  Neurological:     Mental Status: She is alert and oriented to person, place, and time.  Psychiatric:        Behavior: Behavior normal.        Thought Content: Thought content normal.        Judgment: Judgment normal.     Assessment/Plan:     24 year old with retained products of conception and acute blood loss anemia fingerstick hemoglobin in office 7.9. Discussed options for management with patient including dilation and evacuation of retained products of conception.  Discussed risks of this procedure including risk of infection risk of bleeding risk and damage to surrounding pelvic organs.  Patient consented for this procedure.  Will check CBC today.  Discussed bleeding precautions with the patient and encouraged her to come to the ER emergently if she has another episode of heavy bleeding or feels dizzy or lightheaded.  Pap smear today and STD testing.  Patient declined blood lab work with HIV syphilis hepatitis screening.  Patient desires pills as her contraception option which will be initiated after her procedure.  More than 30 minutes were spent face to face with the patient in the room, reviewing the medical record, labs and images, and coordinating  care for the patient. The plan of management was discussed in detail and counseling was provided.    21 MD [facility], [facility] 11/21/2019 8:19 AM

## 2019-11-21 NOTE — Progress Notes (Signed)
 Patient ID: Audrey Cruz, female   DOB: 09/18/1995, 24 y.o.   MRN: [id number]  Reason for Consult: Gynecologic Exam (VAGINAL BLEEDING )   Referred by Sator Nogo, Jasna, MD  Subjective:     HPI:  Audrey Cruz is a 24 y.o. female patient presents today for follow-up from the ER for abnormal uterine bleeding.  She reports that she had a termination in August and since that time has had difficulty with heavy bleeding.  She reports that she has been having extended episodes of bleeding since her abortion to have lasted for 1 to 2 weeks.  She reports that she had a surgical abortion and [place] at about [redacted] weeks gestation.  She reports that previously her menstrual cycles were regular monthly and lasted for 4 days.  She does not usually have heavy menstrual cycle but since that termination she has been having gushing and flooding her blood saturating a pad or tampon more frequently than every hour and having accidents where she leaks through her clothes.  She is also having severe pain that has been preventing her from attending work or school.  She reports that most recently she bled from November 10 until now.  She was seen in the ER and the ultrasound was consistent with retained products of conception.  She was discharged on Provera with plans to follow-up with the GYN provider.  She reports that she has not started on birth control after her termination but she does desire to be on birth control.  She reports that she has been using condoms to prevent pregnancy.  She reports that she has been sexually active but not frequently.  She does report that she has been having episodes of feeling dizzy and syncope.   Gynecological History Menarche: 16 Describes periods as generally monthly regular lasting 4 days. Last pap smear: No history of previous Pap smear. History of STDs: Denies history of sexually transmitted infections Sexually Active: Yes uses condoms.  Obstetrical History   G1P0010 2021 voluntary termination of pregnancy [redacted] weeks gestation surgical abortion  No past medical history on file. Family History  Problem Relation Age of Onset  . Thyroid disease Mother   . Hepatitis C Mother   . Healthy Father    Past Surgical History:  Procedure Laterality Date  . INDUCED ABORTION      Short Social History:  Social History   Tobacco Use  . Smoking status: Never Smoker  . Smokeless tobacco: Never Used  Substance Use Topics  . Alcohol use: No    Allergies  Allergen Reactions  . Amoxicillin Hives  . Penicillins Hives    Current Outpatient Medications  Medication Sig Dispense Refill  . acetaminophen (TYLENOL) 500 MG tablet Take 500 mg by mouth every 6 (six) hours as needed.    . fluticasone (FLONASE) 50 MCG/ACT nasal spray Place 1 spray into both nostrils daily.    . medroxyPROGESTERone (PROVERA) 10 MG tablet Take 1 tablet (10 mg total) by mouth daily. 10 tablet 0   No current facility-administered medications for this visit.    Review of Systems  Constitutional: Negative for chills, fatigue, fever and unexpected weight change.  HENT: Negative for trouble swallowing.  Eyes: Negative for loss of vision.  Respiratory: Negative for cough, shortness of breath and wheezing.  Cardiovascular: Negative for chest pain, leg swelling, palpitations and syncope.  GI: Negative for abdominal pain, blood in stool, diarrhea, nausea and vomiting.  GU: Negative for difficulty urinating, dysuria, frequency   and hematuria.  Musculoskeletal: Negative for back pain, leg pain and joint pain.  Skin: Negative for rash.  Neurological: Negative for dizziness, headaches, light-headedness, numbness and seizures.  Psychiatric: Negative for behavioral problem, confusion, depressed mood and sleep disturbance.        Objective:  Objective   Vitals:   11/20/19 1531  BP: 112/70  Weight: 123 lb 12.8 oz (56.2 kg)  Height: 5\' 4"  (1.626 m)   Body mass index is 21.25  kg/m.  Physical Exam Vitals and nursing note reviewed.  Constitutional:      Appearance: She is well-developed.  HENT:     Head: Normocephalic and atraumatic.  Eyes:     Pupils: Pupils are equal, round, and reactive to light.  Cardiovascular:     Rate and Rhythm: Normal rate and regular rhythm.  Pulmonary:     Effort: Pulmonary effort is normal. No respiratory distress.  Genitourinary:    Comments: External: Normal appearing vulva. No lesions noted.  Speculum examination: Normal appearing cervix. Scant blood in the vaginal vault.  Bimanual examination: Uterus midline, non-tender, normal in size, shape and contour.  No CMT. No adnexal masses. No adnexal tenderness. Pelvis not fixed.  Skin:    General: Skin is warm and dry.  Neurological:     Mental Status: She is alert and oriented to person, place, and time.  Psychiatric:        Behavior: Behavior normal.        Thought Content: Thought content normal.        Judgment: Judgment normal.     Assessment/Plan:     24 year old with retained products of conception and acute blood loss anemia fingerstick hemoglobin in office 7.9. Discussed options for management with patient including dilation and evacuation of retained products of conception.  Discussed risks of this procedure including risk of infection risk of bleeding risk and damage to surrounding pelvic organs.  Patient consented for this procedure.  Will check CBC today.  Discussed bleeding precautions with the patient and encouraged her to come to the ER emergently if she has another episode of heavy bleeding or feels dizzy or lightheaded.  Pap smear today and STD testing.  Patient declined blood lab work with HIV syphilis hepatitis screening.  Patient desires pills as her contraception option which will be initiated after her procedure.  More than 30 minutes were spent face to face with the patient in the room, reviewing the medical record, labs and images, and coordinating  care for the patient. The plan of management was discussed in detail and counseling was provided.    21 MD [facility], [facility] 11/21/2019 8:19 AM

## 2019-11-21 NOTE — Telephone Encounter (Signed)
Called patient to schedule D&E w Jerene Pitch  DOS 11/24 - 10:30  H&P N/A  Covid testing 11/23 @ 8-10:30, [address], drive up and wear mask. Advised pt to quarantine until DOS.  Adv pt to arr at [facility] at 8:30 for pre-admit  Advised that pt may also receive calls from the hospital pharmacy and pre-service center.  Confirmed pt is self pay and provided the number to [facility] [phone].

## 2019-11-21 NOTE — Telephone Encounter (Signed)
-----   Message from Natale Milch, MD sent at 11/21/2019  8:17 AM EST ----- Surgery Booking Request Patient Full Name:  Audrey Cruz  MRN: [id number]  DOB: Apr 23, 1995  Surgeon: Natale Milch, MD  Requested Surgery Date and Time: 11/26/2019 Primary Diagnosis AND Code: Retained products of conceptions Secondary Diagnosis and Code:  Surgical Procedure: Dilation and evacuation RNFA Requested?: No L&D Notification: No Admission Status: same day surgery Length of Surgery: 50 min Special Case Needs: No H&P: No Phone Interview???:  Yes Interpreter: No Medical Clearance:  No Special Scheduling Instructions: No Any known health/anesthesia issues, diabetes, sleep apnea, latex allergy, defibrillator/pacemaker?: No Acuity: P3   (P1 highest, P2 delay may cause harm, P3 low, elective gyn, P4 lowest)

## 2019-11-24 ENCOUNTER — Other Ambulatory Visit
Admission: RE | Admit: 2019-11-24 | Discharge: 2019-11-24 | Disposition: A | Discharge status: Home / Self Care | Payer: Self-pay | Source: Ambulatory Visit | Attending: Obstetrics and Gynecology | Admitting: Obstetrics and Gynecology

## 2019-11-24 DIAGNOSIS — Z20822 Contact with and (suspected) exposure to covid-19: Secondary | ICD-10-CM | POA: Insufficient documentation

## 2019-11-24 DIAGNOSIS — Z01812 Encounter for preprocedural laboratory examination: Secondary | ICD-10-CM | POA: Insufficient documentation

## 2019-11-25 ENCOUNTER — Other Ambulatory Visit: Payer: Self-pay

## 2019-11-25 ENCOUNTER — Other Ambulatory Visit
Admission: RE | Admit: 2019-11-25 | Discharge: 2019-11-25 | Disposition: A | Discharge status: Home / Self Care | Payer: Self-pay | Source: Ambulatory Visit | Attending: Obstetrics and Gynecology | Admitting: Obstetrics and Gynecology

## 2019-11-25 LAB — CBC
HCT: 23.2 % — ABNORMAL LOW (ref 36.0–46.0)
Hemoglobin: 7.2 g/dL — ABNORMAL LOW (ref 12.0–15.0)
MCH: 29.1 pg (ref 26.0–34.0)
MCHC: 31 g/dL (ref 30.0–36.0)
MCV: 93.9 fL (ref 80.0–100.0)
Platelets: 372 10*3/uL (ref 150–400)
RBC: 2.47 MIL/uL — ABNORMAL LOW (ref 3.87–5.11)
RDW: 13.2 % (ref 11.5–15.5)
WBC: 11.6 10*3/uL — ABNORMAL HIGH (ref 4.0–10.5)
nRBC: 0 % (ref 0.0–0.2)

## 2019-11-25 LAB — CYTOLOGY - PAP
Chlamydia: NEGATIVE
Comment: NEGATIVE
Comment: NORMAL
Diagnosis: NEGATIVE
Neisseria Gonorrhea: NEGATIVE

## 2019-11-25 MED ORDER — DOXYCYCLINE HYCLATE 100 MG IV SOLR
200.0000 mg | INTRAVENOUS | Status: AC
  Administered 2019-11-26: 200 mg via INTRAVENOUS
  Filled 2019-11-25: qty 200

## 2019-11-26 ENCOUNTER — Ambulatory Visit: Payer: Self-pay | Admitting: Anesthesiology

## 2019-11-26 ENCOUNTER — Encounter: Payer: Self-pay | Admitting: Obstetrics and Gynecology

## 2019-11-26 ENCOUNTER — Encounter
Admission: RE | Disposition: A | Discharge status: Home / Self Care | Payer: Self-pay | Source: Home / Self Care | Attending: Obstetrics and Gynecology

## 2019-11-26 ENCOUNTER — Ambulatory Visit
Admission: RE | Admit: 2019-11-26 | Discharge: 2019-11-26 | Disposition: A | Discharge status: Home / Self Care | Payer: Self-pay | Attending: Obstetrics and Gynecology | Admitting: Obstetrics and Gynecology

## 2019-11-26 DIAGNOSIS — Z3A12 12 weeks gestation of pregnancy: Secondary | ICD-10-CM | POA: Insufficient documentation

## 2019-11-26 DIAGNOSIS — O0289 Other abnormal products of conception: Secondary | ICD-10-CM

## 2019-11-26 DIAGNOSIS — O034 Incomplete spontaneous abortion without complication: Secondary | ICD-10-CM

## 2019-11-26 DIAGNOSIS — D62 Acute posthemorrhagic anemia: Secondary | ICD-10-CM | POA: Insufficient documentation

## 2019-11-26 DIAGNOSIS — O411010 Infection of amniotic sac and membranes, unspecified, first trimester, not applicable or unspecified: Secondary | ICD-10-CM | POA: Insufficient documentation

## 2019-11-26 DIAGNOSIS — Z880 Allergy status to penicillin: Secondary | ICD-10-CM | POA: Insufficient documentation

## 2019-11-26 HISTORY — PX: DILATION AND EVACUATION: SHX1459

## 2019-11-26 LAB — URINE DRUG SCREEN, QUALITATIVE (ARMC ONLY)
Amphetamines, Ur Screen: NOT DETECTED
Barbiturates, Ur Screen: NOT DETECTED
Benzodiazepine, Ur Scrn: NOT DETECTED
Cannabinoid 50 Ng, Ur ~~LOC~~: POSITIVE — AB
Cocaine Metabolite,Ur ~~LOC~~: NOT DETECTED
MDMA (Ecstasy)Ur Screen: NOT DETECTED
Methadone Scn, Ur: NOT DETECTED
Opiate, Ur Screen: NOT DETECTED
Phencyclidine (PCP) Ur S: NOT DETECTED
Tricyclic, Ur Screen: NOT DETECTED

## 2019-11-26 LAB — CBC
HCT: 23.3 % — ABNORMAL LOW (ref 36.0–46.0)
Hemoglobin: 7.4 g/dL — ABNORMAL LOW (ref 12.0–15.0)
MCH: 29.1 pg (ref 26.0–34.0)
MCHC: 31.8 g/dL (ref 30.0–36.0)
MCV: 91.7 fL (ref 80.0–100.0)
Platelets: 385 10*3/uL (ref 150–400)
RBC: 2.54 MIL/uL — ABNORMAL LOW (ref 3.87–5.11)
RDW: 13.2 % (ref 11.5–15.5)
WBC: 14 10*3/uL — ABNORMAL HIGH (ref 4.0–10.5)
nRBC: 0 % (ref 0.0–0.2)

## 2019-11-26 LAB — SARS CORONAVIRUS 2 (TAT 6-24 HRS): SARS Coronavirus 2: NEGATIVE

## 2019-11-26 LAB — ABO/RH: ABO/RH(D): A POS

## 2019-11-26 LAB — POCT PREGNANCY, URINE: Preg Test, Ur: NEGATIVE

## 2019-11-26 SURGERY — DILATION AND EVACUATION, UTERUS
Anesthesia: General

## 2019-11-26 MED ORDER — IBUPROFEN 600 MG PO TABS: 600.0000 mg | ORAL_TABLET | Freq: Four times a day (QID) | ORAL | 0 refills | Status: DC | PRN

## 2019-11-26 MED ORDER — LACTATED RINGERS IV SOLN: INTRAVENOUS | Status: DC

## 2019-11-26 MED ORDER — FAMOTIDINE 20 MG PO TABS: 20.0000 mg | ORAL_TABLET | Freq: Once | ORAL | Status: AC

## 2019-11-26 MED ORDER — CHLORHEXIDINE GLUCONATE 0.12 % MT SOLN
OROMUCOSAL | Status: AC
  Administered 2019-11-26: 15 mL via OROMUCOSAL
  Filled 2019-11-26: qty 15

## 2019-11-26 MED ORDER — SODIUM BICARBONATE 8.4 % IV SOLN
INTRAVENOUS | Status: AC
  Filled 2019-11-26: qty 50

## 2019-11-26 MED ORDER — ONDANSETRON HCL 4 MG/2ML IJ SOLN: 4.0000 mg | Freq: Once | INTRAMUSCULAR | Status: DC | PRN

## 2019-11-26 MED ORDER — PROPOFOL 10 MG/ML IV BOLUS
INTRAVENOUS | Status: AC
  Filled 2019-11-26: qty 20

## 2019-11-26 MED ORDER — PROPOFOL 10 MG/ML IV BOLUS
INTRAVENOUS | Status: DC | PRN
  Administered 2019-11-26: 50 mg via INTRAVENOUS
  Administered 2019-11-26: 20 mg via INTRAVENOUS

## 2019-11-26 MED ORDER — OXYCODONE HCL 5 MG PO TABS
5.0000 mg | ORAL_TABLET | Freq: Once | ORAL | Status: AC | PRN
  Administered 2019-11-26: 5 mg via ORAL

## 2019-11-26 MED ORDER — ACETAMINOPHEN 10 MG/ML IV SOLN
1000.0000 mg | Freq: Once | INTRAVENOUS | Status: DC | PRN
  Administered 2019-11-26: 1000 mg via INTRAVENOUS

## 2019-11-26 MED ORDER — OXYCODONE HCL 5 MG PO TABS
ORAL_TABLET | ORAL | Status: AC
  Filled 2019-11-26: qty 1

## 2019-11-26 MED ORDER — SODIUM CHLORIDE 0.9 % IV SOLN: INTRAVENOUS | Status: DC | PRN

## 2019-11-26 MED ORDER — ORAL CARE MOUTH RINSE: 15.0000 mL | Freq: Once | OROMUCOSAL | Status: AC

## 2019-11-26 MED ORDER — PROPOFOL 500 MG/50ML IV EMUL
INTRAVENOUS | Status: DC | PRN
  Administered 2019-11-26: 200 ug/kg/min via INTRAVENOUS

## 2019-11-26 MED ORDER — ACETAMINOPHEN 10 MG/ML IV SOLN
INTRAVENOUS | Status: AC
  Filled 2019-11-26: qty 100

## 2019-11-26 MED ORDER — FENTANYL CITRATE (PF) 100 MCG/2ML IJ SOLN
INTRAMUSCULAR | Status: AC
  Filled 2019-11-26: qty 2

## 2019-11-26 MED ORDER — FAMOTIDINE 20 MG PO TABS
ORAL_TABLET | ORAL | Status: AC
  Administered 2019-11-26: 20 mg via ORAL
  Filled 2019-11-26: qty 1

## 2019-11-26 MED ORDER — FENTANYL CITRATE (PF) 100 MCG/2ML IJ SOLN: 25.0000 ug | INTRAMUSCULAR | Status: DC | PRN

## 2019-11-26 MED ORDER — MIDAZOLAM HCL 2 MG/2ML IJ SOLN
INTRAMUSCULAR | Status: DC | PRN
  Administered 2019-11-26: 2 mg via INTRAVENOUS

## 2019-11-26 MED ORDER — SODIUM CHLORIDE 0.9% IV SOLUTION: Freq: Once | INTRAVENOUS | Status: DC

## 2019-11-26 MED ORDER — PROPOFOL 500 MG/50ML IV EMUL
INTRAVENOUS | Status: AC
  Filled 2019-11-26: qty 50

## 2019-11-26 MED ORDER — DEXAMETHASONE SODIUM PHOSPHATE 10 MG/ML IJ SOLN
INTRAMUSCULAR | Status: DC | PRN
  Administered 2019-11-26: 10 mg via INTRAVENOUS

## 2019-11-26 MED ORDER — MIDAZOLAM HCL 2 MG/2ML IJ SOLN
INTRAMUSCULAR | Status: AC
  Filled 2019-11-26: qty 2

## 2019-11-26 MED ORDER — CHLORHEXIDINE GLUCONATE 0.12 % MT SOLN: 15.0000 mL | Freq: Once | OROMUCOSAL | Status: AC

## 2019-11-26 MED ORDER — ONDANSETRON HCL 4 MG/2ML IJ SOLN
INTRAMUSCULAR | Status: DC | PRN
  Administered 2019-11-26: 4 mg via INTRAVENOUS

## 2019-11-26 MED ORDER — OXYCODONE HCL 5 MG/5ML PO SOLN: 5.0000 mg | Freq: Once | ORAL | Status: AC | PRN

## 2019-11-26 MED ORDER — GLYCOPYRROLATE 0.2 MG/ML IJ SOLN
INTRAMUSCULAR | Status: DC | PRN
  Administered 2019-11-26: .2 mg via INTRAVENOUS

## 2019-11-26 MED ORDER — FENTANYL CITRATE (PF) 100 MCG/2ML IJ SOLN
INTRAMUSCULAR | Status: DC | PRN
  Administered 2019-11-26 (×2): 50 ug via INTRAVENOUS

## 2019-11-26 MED ORDER — POVIDONE-IODINE 10 % EX SWAB: 2.0000 "application " | Freq: Once | CUTANEOUS | Status: DC

## 2019-11-26 MED ORDER — TRAMADOL HCL 50 MG PO TABS
50.0000 mg | ORAL_TABLET | Freq: Four times a day (QID) | ORAL | 0 refills | Status: DC | PRN
Start: 2019-11-26 — End: 2020-06-29

## 2019-11-26 MED ORDER — KETOROLAC TROMETHAMINE 30 MG/ML IJ SOLN
INTRAMUSCULAR | Status: DC | PRN
  Administered 2019-11-26: 30 mg via INTRAVENOUS

## 2019-11-26 MED ORDER — DEXMEDETOMIDINE (PRECEDEX) IN NS 20 MCG/5ML (4 MCG/ML) IV SYRINGE
PREFILLED_SYRINGE | INTRAVENOUS | Status: DC | PRN
  Administered 2019-11-26: 8 ug via INTRAVENOUS
  Administered 2019-11-26: 4 ug via INTRAVENOUS
  Administered 2019-11-26: 8 ug via INTRAVENOUS

## 2019-11-26 SURGICAL SUPPLY — 23 items
BAG COUNTER SPONGE EZ (MISCELLANEOUS) ×2 IMPLANT
BAG SPNG 4X4 CLR HAZ (MISCELLANEOUS) ×1
CATH ROBINSON RED A/P 16FR (CATHETERS) IMPLANT
FILTER UTR ASPR SPEC (MISCELLANEOUS) ×1 IMPLANT
FLTR UTR ASPR SPEC (MISCELLANEOUS) ×2
GLOVE BIOGEL PI IND STRL 6.5 (GLOVE) ×2 IMPLANT
GLOVE BIOGEL PI INDICATOR 6.5 (GLOVE) ×2
GLOVE SURG SYN 6.5 ES PF (GLOVE) ×4 IMPLANT
GOWN STRL REUS W/ TWL LRG LVL3 (GOWN DISPOSABLE) ×2 IMPLANT
GOWN STRL REUS W/TWL LRG LVL3 (GOWN DISPOSABLE) ×4
KIT BERKELEY 1ST TRIMESTER 3/8 (MISCELLANEOUS) ×2 IMPLANT
KIT TURNOVER CYSTO (KITS) ×2 IMPLANT
MANIFOLD NEPTUNE II (INSTRUMENTS) ×2 IMPLANT
NS IRRIG 500ML POUR BTL (IV SOLUTION) ×2 IMPLANT
PACK DNC HYST (MISCELLANEOUS) ×2 IMPLANT
PAD OB MATERNITY 4.3X12.25 (PERSONAL CARE ITEMS) ×2 IMPLANT
PAD PREP 24X41 OB/GYN DISP (PERSONAL CARE ITEMS) ×2 IMPLANT
SET BERKELEY SUCTION TUBING (SUCTIONS) ×2 IMPLANT
TOWEL OR 17X26 4PK STRL BLUE (TOWEL DISPOSABLE) ×2 IMPLANT
VACURETTE 10 RIGID CVD (CANNULA) IMPLANT
VACURETTE 12 RIGID CVD (CANNULA) IMPLANT
VACURETTE 8 RIGID CVD (CANNULA) IMPLANT
VACURETTE 8MM F TIP (MISCELLANEOUS) ×2 IMPLANT

## 2019-11-26 NOTE — Discharge Instructions (Addendum)
Dilation and Curettage or Vacuum Curettage, Care After These instructions give you information about caring for yourself after your procedure. Your doctor may also give you more specific instructions. Call your doctor if you have any problems or questions after your procedure. Follow these instructions at home: Activity  Do not drive or use heavy machinery while taking prescription pain medicine.  For 24 hours after your procedure, avoid driving.  Take short walks often, followed by rest periods. Ask your doctor what activities are safe for you. After one or two days, you may be able to return to your normal activities.  Do not lift anything that is heavier than 10 lb (4.5 kg) until your doctor approves.  For at least 2 weeks, or as long as told by your doctor: ? Do not douche. ? Do not use tampons. ? Do not have sex.   AMBULATORY SURGERY  DISCHARGE INSTRUCTIONS   1) The drugs that you were given will stay in your system until tomorrow so for the next 24 hours you should not:  A) Drive an automobile B) Make any legal decisions C) Drink any alcoholic beverage   2) You may resume regular meals tomorrow.  Today it is better to start with liquids and gradually work up to solid foods.  You may eat anything you prefer, but it is better to start with liquids, then soup and crackers, and gradually work up to solid foods.   3) Please notify your doctor immediately if you have any unusual bleeding, trouble breathing, redness and pain at the surgery site, drainage, fever, or pain not relieved by medication.  4) Your post-operative visit with Dr.                                     is: Date:                        Time:    Please call to schedule your post-operative visit.  5) Additional Instructions:  General instructions   Take over-the-counter and prescription medicines only as told by your doctor. This is very important if you take blood thinning medicine.  Do not take  baths, swim, or use a hot tub until your doctor approves. Take showers instead of baths.  Wear compression stockings as told by your doctor.  It is up to you to get the results of your procedure. Ask your doctor when your results will be ready.  Keep all follow-up visits as told by your doctor. This is important. Contact a doctor if:  You have very bad cramps that get worse or do not get better with medicine.  You have very bad pain in your belly (abdomen).  You cannot drink fluids without throwing up (vomiting).  You get pain in a different part of the area between your belly and thighs (pelvis).  You have bad-smelling discharge from your vagina.  You have a rash. Get help right away if:  You are bleeding a lot from your vagina. A lot of bleeding means soaking more than one sanitary pad in an hour, for 2 hours in a row.  You have clumps of blood (blood clots) coming from your vagina.  You have a fever or chills.  Your belly feels very tender or hard.  You have chest pain.  You have trouble breathing.  You cough up blood.  You feel dizzy.  You feel light-headed.  You pass out (faint).  You have pain in your neck or shoulder area. Summary  Take short walks often, followed by rest periods. Ask your doctor what activities are safe for you. After one or two days, you may be able to return to your normal activities.  Do not lift anything that is heavier than 10 lb (4.5 kg) until your doctor approves.  Do not take baths, swim, or use a hot tub until your doctor approves. Take showers instead of baths.  Contact your doctor if you have any symptoms of infection, like bad-smelling discharge from your vagina. This information is not intended to replace advice given to you by your health care provider. Make sure you discuss any questions you have with your health care provider. Document Revised: 12/01/2016 Document Reviewed: 09/06/2015 Elsevier Patient Education  2020  [employer]   AMBULATORY SURGERY  DISCHARGE INSTRUCTIONS   6) The drugs that you were given will stay in your system until tomorrow so for the next 24 hours you should not:  D) Drive an automobile E) Make any legal decisions F) Drink any alcoholic beverage   7) You may resume regular meals tomorrow.  Today it is better to start with liquids and gradually work up to solid foods.  You may eat anything you prefer, but it is better to start with liquids, then soup and crackers, and gradually work up to solid foods.   8) Please notify your doctor immediately if you have any unusual bleeding, trouble breathing, redness and pain at the surgery site, drainage, fever, or pain not relieved by medication.    9) Additional Instructions:        Please contact your physician with any problems or Same Day Surgery at [phone], Monday through Friday 6 am to 4 pm, or [place] at [facility] number at [phone].

## 2019-11-26 NOTE — Interval H&P Note (Signed)
History and Physical Interval Note:  11/26/2019 11:39 AM  Audrey Cruz  has presented today for surgery, with the diagnosis of Retained products of conceptions.  The various methods of treatment have been discussed with the patient and family. After consideration of risks, benefits and other options for treatment, the patient has consented to  Procedure(s): DILATATION AND EVACUATION (N/A) as a surgical intervention.  The patient's history has been reviewed, patient examined, no change in status, stable for surgery.  I have reviewed the patient's chart and labs.  Questions were answered to the patient's satisfaction.     Nanie Dunkleberger R Shella Lahman

## 2019-11-26 NOTE — Transfer of Care (Signed)
Immediate Anesthesia Transfer of Care Note  Patient: Audrey Cruz  Procedure(s) Performed: DILATATION AND EVACUATION (N/A )  Patient Location: PACU  Anesthesia Type:General  Level of Consciousness: drowsy and patient cooperative  Airway & Oxygen Therapy: Patient Spontanous Breathing and Patient connected to face mask oxygen  Post-op Assessment: Report given to RN and Post -op Vital signs reviewed and stable  Post vital signs: Reviewed and stable  Last Vitals:  Vitals Value Taken Time  BP 105/73 11/26/19 1204  Temp 36.3 C 11/26/19 1156  Pulse 75 11/26/19 1205  Resp 15 11/26/19 1205  SpO2 99 % 11/26/19 1205  Vitals shown include unvalidated device data.  Last Pain:  Vitals:   11/26/19 1156  TempSrc: Temporal  PainSc:          Complications: No complications documented.

## 2019-11-26 NOTE — Anesthesia Preprocedure Evaluation (Signed)
Anesthesia Evaluation  Patient identified by MRN, date of birth, ID band Patient awake    Reviewed: Allergy & Precautions, NPO status , Patient's Chart, lab work & pertinent test results  History of Anesthesia Complications Negative for: history of anesthetic complications  Airway Mallampati: I  TM Distance: >3 FB Neck ROM: Full    Dental no notable dental hx. (+) Teeth Intact   Pulmonary neg pulmonary ROS, neg sleep apnea, neg COPD, Patient abstained from smoking.Not current smoker,    Pulmonary exam normal breath sounds clear to auscultation       Cardiovascular Exercise Tolerance: Good METS(-) hypertension(-) CAD and (-) Past MI negative cardio ROS  (-) dysrhythmias  Rhythm:Regular Rate:Normal - Systolic murmurs    Neuro/Psych negative neurological ROS  negative psych ROS   GI/Hepatic neg GERD  ,(+)     (-) substance abuse  ,   Endo/Other  neg diabetes  Renal/GU negative Renal ROS     Musculoskeletal   Abdominal   Peds  Hematology  (+) anemia , Patient mildly symptomatic from the anemia - some dyspnea and some light headedness, but much better than prior   Anesthesia Other Findings History reviewed. No pertinent past medical history.  Reproductive/Obstetrics Retained products of conception                             Anesthesia Physical Anesthesia Plan  ASA: II  Anesthesia Plan: General   Post-op Pain Management:    Induction: Intravenous  PONV Risk Score and Plan: 4 or greater and Ondansetron, Propofol infusion, TIVA, Dexamethasone and Midazolam  Airway Management Planned: Natural Airway and LMA  Additional Equipment: None  Intra-op Plan:   Post-operative Plan:   Informed Consent: I have reviewed the patients History and Physical, chart, labs and discussed the procedure including the risks, benefits and alternatives for the proposed anesthesia with the patient or  authorized representative who has indicated his/her understanding and acceptance.     Dental advisory given  Plan Discussed with: CRNA and Surgeon  Anesthesia Plan Comments: (Discussed risks of anesthesia with patient, including possibility of difficulty with spontaneous ventilation under anesthesia necessitating airway intervention, PONV, and rare risks such as cardiac or respiratory or neurological events. Patient understands.  Discussed natural airway vs LMA.)        Anesthesia Quick Evaluation

## 2019-11-26 NOTE — Op Note (Signed)
  Operative Note  11/26/2019 11:35 AM  PRE-OP DIAGNOSIS: Retained products of conceptions   POST-OP DIAGNOSIS: same  SURGEON: Adelene Idler MD  ANESTHESIA: Choice   PROCEDURE: Procedure(s): DILATATION AND EVACUATION   ESTIMATED BLOOD LOSS: 20 cc   SPECIMENS: POC   COMPLICATIONS: none  DISPOSITION: PACU - hemodynamically stable.  CONDITION: stable  FINDINGS: Exam under anesthesia revealed a 8 wk size uterus without palpable adnexal masses.   INDICATION FOR PROCEDURE: Retained products of conception  PROCEDURE IN DETAIL: After informed consent was obtained, the patient was taken to the operating room where anesthesia was obtained without difficulty. The patient was positioned in the dorsal lithotomy position with [employer]. Time out was performed and an exam under anesthesia was performed. The vagina, perineum, and lower abdomen were prepped and draped in a normal sterile fashion. The bladder was emptied with an I&O catheter. A speculum was placed into the vagina and the cervix was grasped with a single toothed tenaculum.  The cervix was gently dilated to 14 [place] with  [employer] dilators. The suction was then tested and found to be adequate, and a 7 flexible suction cannula was advanced into the uterine cavity. The suction was activated and the contents of the uterus were aspirated until no further tissue was obtained. The uterus was then curetted to gritty texture throughout.  At the end of the procedure bleeding was noted to beMinimal.  All instruments were then removed from the vagina.The patient tolerated the procedure well. All sponge, instrument, and needle counts were correct. The patient was taken to the recovery room in good condition.   Adelene Idler MD [facility], [facility] 11/26/2019 11:35 AM

## 2019-11-26 NOTE — OR Nursing (Signed)
Patient out of department into OR via stretcher with blood infusing; green blood bank paper hanging attached to RBC unit.

## 2019-11-28 LAB — TYPE AND SCREEN
ABO/RH(D): A POS
Antibody Screen: NEGATIVE
Unit division: 0
Unit division: 0

## 2019-11-28 LAB — BPAM RBC
Blood Product Expiration Date: 202112022359
Blood Product Expiration Date: 202112162359
ISSUE DATE / TIME: 202111241007
Unit Type and Rh: 6200
Unit Type and Rh: 6200

## 2019-11-28 LAB — SURGICAL PATHOLOGY

## 2019-11-28 LAB — PREPARE RBC (CROSSMATCH)

## 2019-11-29 NOTE — Anesthesia Postprocedure Evaluation (Signed)
Anesthesia Post Note  Patient: Audrey Cruz  Procedure(s) Performed: DILATATION AND EVACUATION (N/A )  Patient location during evaluation: PACU Anesthesia Type: General Level of consciousness: awake and alert Pain management: pain level controlled Vital Signs Assessment: post-procedure vital signs reviewed and stable Respiratory status: spontaneous breathing, nonlabored ventilation, respiratory function stable and patient connected to nasal cannula oxygen Cardiovascular status: blood pressure returned to baseline and stable Postop Assessment: no apparent nausea or vomiting Anesthetic complications: no   No complications documented.   Last Vitals:  Vitals:   11/26/19 1204 11/26/19 1223  BP: 105/73 107/67  Pulse: 83   Resp: (!) 21 16  Temp:  36.7 C  SpO2: 100% 100%    Last Pain:  Vitals:   11/26/19 1223  TempSrc: Temporal  PainSc: 3                  Corinda Gubler

## 2019-12-04 ENCOUNTER — Encounter: Payer: Self-pay | Admitting: Obstetrics and Gynecology

## 2019-12-04 ENCOUNTER — Other Ambulatory Visit: Payer: Self-pay

## 2019-12-04 ENCOUNTER — Ambulatory Visit (INDEPENDENT_AMBULATORY_CARE_PROVIDER_SITE_OTHER): Payer: Self-pay | Admitting: Obstetrics and Gynecology

## 2019-12-04 VITALS — BP 100/70 | Ht 64.0 in | Wt 125.6 lb

## 2019-12-04 DIAGNOSIS — B373 Candidiasis of vulva and vagina: Secondary | ICD-10-CM

## 2019-12-04 DIAGNOSIS — B3731 Acute candidiasis of vulva and vagina: Secondary | ICD-10-CM

## 2019-12-04 DIAGNOSIS — Z9889 Other specified postprocedural states: Secondary | ICD-10-CM

## 2019-12-04 MED ORDER — FLUCONAZOLE 150 MG PO TABS: 150.0000 mg | ORAL_TABLET | ORAL | 0 refills | Status: AC

## 2019-12-04 MED ORDER — NORETHIN ACE-ETH ESTRAD-FE 1-20 MG-MCG PO TABS: 1.0000 | ORAL_TABLET | Freq: Every day | ORAL | 11 refills | Status: DC

## 2019-12-04 NOTE — Progress Notes (Signed)
°  Postoperative Follow-up Patient presents post op from D&E for retained products of conception, 1 week ago.  Subjective: Patient reports some improvement in her preop symptoms. Eating a regular diet without difficulty. Pain is controlled without any medications.  Activity: normal activities of daily living. Patient reports additional symptom's since surgery of None.  Objective: BP 100/70    Ht 5\' 4"  (1.626 m)    Wt 125 lb 9.6 oz (57 kg)    LMP 11/12/2019 (Exact Date)    BMI 21.56 kg/m  Physical Exam Constitutional:      Appearance: She is well-developed.  Genitourinary:     Vagina and uterus normal.     No lesions in the vagina.     No cervical motion tenderness.     No right or left adnexal mass present.  HENT:     Head: Normocephalic and atraumatic.  Neck:     Thyroid: No thyromegaly.  Cardiovascular:     Rate and Rhythm: Normal rate and regular rhythm.     Heart sounds: Normal heart sounds.  Pulmonary:     Effort: Pulmonary effort is normal.     Breath sounds: Normal breath sounds.  Chest:     Breasts:        Right: No inverted nipple, mass, nipple discharge or skin change.        Left: No inverted nipple, mass, nipple discharge or skin change.  Abdominal:     General: Bowel sounds are normal. There is no distension.     Palpations: Abdomen is soft. There is no mass.  Musculoskeletal:     Cervical back: Neck supple.  Neurological:     Mental Status: She is alert and oriented to person, place, and time.  Skin:    General: Skin is warm and dry.  Psychiatric:        Behavior: Behavior normal.        Thought Content: Thought content normal.        Judgment: Judgment normal.  Vitals reviewed.     Assessment: s/p :  D&E,  stable  Plan: Patient has done well after surgery with no apparent complications.  I have discussed the post-operative course to date, and the expected progress moving forward.  The patient understands what complications to be concerned about.  I  will see the patient in routine follow up, or sooner if needed.    Activity plan: No restriction..  Pelvic rest.  rx for OCP sent rx for diflucan sent  Audrey Cruz 12/04/2019, 4:06 PM

## 2019-12-04 NOTE — Progress Notes (Signed)
POST op visit and she would like to get a birth control as well.

## 2019-12-04 NOTE — Patient Instructions (Signed)
Managing Anxiety, Adult After being diagnosed with an anxiety disorder, you may be relieved to know why you have felt or behaved a certain way. You may also feel overwhelmed about the treatment ahead and what it will mean for your life. With care and support, you can manage this condition and recover from it. How to manage lifestyle changes Managing stress and anxiety  Stress is your body's reaction to life changes and events, both good and bad. Most stress will last just a few hours, but stress can be ongoing and can lead to more than just stress. Although stress can play a major role in anxiety, it is not the same as anxiety. Stress is usually caused by something external, such as a deadline, test, or competition. Stress normally passes after the triggering event has ended.  Anxiety is caused by something internal, such as imagining a terrible outcome or worrying that something will go wrong that will devastate you. Anxiety often does not go away even after the triggering event is over, and it can become long-term (chronic) worry. It is important to understand the differences between stress and anxiety and to manage your stress effectively so that it does not lead to an anxious response. Talk with your health care provider or a counselor to learn more about reducing anxiety and stress. He or she may suggest tension reduction techniques, such as:  Music therapy. This can include creating or listening to music that you enjoy and that inspires you.  Mindfulness-based meditation. This involves being aware of your normal breaths while not trying to control your breathing. It can be done while sitting or walking.  Centering prayer. This involves focusing on a word, phrase, or sacred image that means something to you and brings you peace.  Deep breathing. To do this, expand your stomach and inhale slowly through your nose. Hold your breath for 3-5 seconds. Then exhale slowly, letting your stomach muscles  relax.  Self-talk. This involves identifying thought patterns that lead to anxiety reactions and changing those patterns.  Muscle relaxation. This involves tensing muscles and then relaxing them. Choose a tension reduction technique that suits your lifestyle and personality. These techniques take time and practice. Set aside 5-15 minutes a day to do them. Therapists can offer counseling and training in these techniques. The training to help with anxiety may be covered by some insurance plans. Other things you can do to manage stress and anxiety include:  Keeping a stress/anxiety diary. This can help you learn what triggers your reaction and then learn ways to manage your response.  Thinking about how you react to certain situations. You may not be able to control everything, but you can control your response.  Making time for activities that help you relax and not feeling guilty about spending your time in this way.  Visual imagery and yoga can help you stay calm and relax.  Medicines Medicines can help ease symptoms. Medicines for anxiety include:  Anti-anxiety drugs.  Antidepressants. Medicines are often used as a primary treatment for anxiety disorder. Medicines will be prescribed by a health care provider. When used together, medicines, psychotherapy, and tension reduction techniques may be the most effective treatment. Relationships Relationships can play a big part in helping you recover. Try to spend more time connecting with trusted friends and family members. Consider going to couples counseling, taking family education classes, or going to family therapy. Therapy can help you and others better understand your condition. How to recognize changes in your   anxiety Everyone responds differently to treatment for anxiety. Recovery from anxiety happens when symptoms decrease and stop interfering with your daily activities at home or work. This may mean that you will start to:  Have  better concentration and focus. Worry will interfere less in your daily thinking.  Sleep better.  Be less irritable.  Have more energy.  Have improved memory. It is important to recognize when your condition is getting worse. Contact your health care provider if your symptoms interfere with home or work and you feel like your condition is not improving. Follow these instructions at home: Activity  Exercise. Most adults should do the following: ? Exercise for at least 150 minutes each week. The exercise should increase your heart rate and make you sweat (moderate-intensity exercise). ? Strengthening exercises at least twice a week.  Get the right amount and quality of sleep. Most adults need 7-9 hours of sleep each night. Lifestyle   Eat a healthy diet that includes plenty of vegetables, fruits, whole grains, low-fat dairy products, and lean protein. Do not eat a lot of foods that are high in solid fats, added sugars, or salt.  Make choices that simplify your life.  Do not use any products that contain nicotine or tobacco, such as cigarettes, e-cigarettes, and chewing tobacco. If you need help quitting, ask your health care provider.  Avoid caffeine, alcohol, and certain over-the-counter cold medicines. These may make you feel worse. Ask your pharmacist which medicines to avoid. General instructions  Take over-the-counter and prescription medicines only as told by your health care provider.  Keep all follow-up visits as told by your health care provider. This is important. Where to find support You can get help and support from these sources:  Self-help groups.  Online and community organizations.  A trusted spiritual leader.  Couples counseling.  Family education classes.  Family therapy. Where to find more information You may find that joining a support group helps you deal with your anxiety. The following sources can help you locate counselors or support groups near  you:  Mental Health America: www.mentalhealthamerica.net  Anxiety and Depression Association of America (ADAA): www.adaa.org  National Alliance on Mental Illness (NAMI): www.nami.org Contact a health care provider if you:  Have a hard time staying focused or finishing daily tasks.  Spend many hours a day feeling worried about everyday life.  Become exhausted by worry.  Start to have headaches, feel tense, or have nausea.  Urinate more than normal.  Have diarrhea. Get help right away if you have:  A racing heart and shortness of breath.  Thoughts of hurting yourself or others. If you ever feel like you may hurt yourself or others, or have thoughts about taking your own life, get help right away. You can go to your nearest emergency department or call:  Your local emergency services (911 in the [place]  A suicide crisis helpline, such as the National Suicide Prevention Lifeline at 1-800-273-8255. This is open 24 hours a day. Summary  Taking steps to learn and use tension reduction techniques can help calm you and help prevent triggering an anxiety reaction.  When used together, medicines, psychotherapy, and tension reduction techniques may be the most effective treatment.  Family, friends, and partners can play a big part in helping you recover from an anxiety disorder. This information is not intended to replace advice given to you by your health care provider. Make sure you discuss any questions you have with your health care provider. Document Revised:   05/21/2018 Document Reviewed: 05/21/2018 Elsevier Patient Education  2020 [employer].   Hormonal Contraception Information Hormonal contraception is a type of birth control that uses hormones to prevent pregnancy. It usually involves a combination of the hormones estrogen and progesterone or only the hormone progesterone. Hormonal contraception works in these ways:  It thickens the mucus in the cervix, making it harder  for sperm to enter the uterus.  It changes the lining of the uterus, making it harder for an egg to implant.  It may stop the ovaries from releasing eggs (ovulation). Some women who take hormonal contraceptives that contain only progesterone may continue to ovulate. Hormonal contraception cannot prevent sexually transmitted infections (STIs). Pregnancy may still occur. Estrogen and progesterone contraceptives Contraceptives that use a combination of estrogen and progesterone are available in these forms:  Pill. Pills come in different combinations of hormones. They must be taken at the same time each day. Pills can affect your period, causing you to get your period once every three months or not at all.  Patch. The patch must be worn on the lower abdomen for three weeks and then removed on the fourth.  Vaginal ring. The ring is placed in the vagina and left there for three weeks. It is then removed for one week. Progesterone contraceptives Contraceptives that use progesterone only are available in these forms:  Pill. Pills should be taken every day of the cycle.  Intrauterine device (IUD). This device is inserted into the uterus and removed or replaced every five years or sooner.  Implant. Plastic rods are placed under the skin of the upper arm. They are removed or replaced every three years or sooner.  Injection. The injection is given once every 90 days. What are the side effects? The side effects of estrogen and progesterone contraceptives include:  Nausea.  Headaches.  Breast tenderness.  Bleeding or spotting between menstrual cycles.  High blood pressure (rare).  Strokes, heart attacks, or blood clots (rare) Side effects of progesterone-only contraceptives include:  Nausea.  Headaches.  Breast tenderness.  Unpredictable menstrual bleeding.  High blood pressure (rare). Talk to your health care provider about what side effects may affect you. Where to find more  information  Ask your health care provider for more information and resources about hormonal contraception.  [place] Department of Health and Cytogeneticist on Women's Health: http://hoffman.com/ Questions to ask:  What type of hormonal contraception is right for me?  How long should I plan to use hormonal contraception?  What are the side effects of the hormonal contraception method I choose?  How can I prevent STIs while using hormonal contraception? Contact a health care provider if:  You start taking hormonal contraceptives and you develop persistent or severe side effects. Summary  Estrogen and progesterone are hormones used in many forms of birth control.  Talk to your health care provider about what side effects may affect you.  Hormonal contraception cannot prevent sexually transmitted infections (STIs).  Ask your health care provider for more information and resources about hormonal contraception. This information is not intended to replace advice given to you by your health care provider. Make sure you discuss any questions you have with your health care provider. Document Revised: 04/15/2018 Document Reviewed: 11/19/2015 Elsevier Patient Education  2020 [employer]. Contraception Choices Contraception, also called birth control, refers to methods or devices that prevent pregnancy. Hormonal methods Contraceptive implant  A contraceptive implant is a thin, plastic tube that contains a hormone. It is inserted  into the upper part of the arm. It can remain in place for up to 3 years. Progestin-only injections Progestin-only injections are injections of progestin, a synthetic form of the hormone progesterone. They are given every 3 months by a health care provider. Birth control pills  Birth control pills are pills that contain hormones that prevent pregnancy. They must be taken once a day, preferably at the same time each day. Birth control patch  The birth  control patch contains hormones that prevent pregnancy. It is placed on the skin and must be changed once a week for three weeks and removed on the fourth week. A prescription is needed to use this method of contraception. Vaginal ring  A vaginal ring contains hormones that prevent pregnancy. It is placed in the vagina for three weeks and removed on the fourth week. After that, the process is repeated with a new ring. A prescription is needed to use this method of contraception. Emergency contraceptive Emergency contraceptives prevent pregnancy after unprotected sex. They come in pill form and can be taken up to 5 days after sex. They work best the sooner they are taken after having sex. Most emergency contraceptives are available without a prescription. This method should not be used as your only form of birth control. Barrier methods Female condom  A female condom is a thin sheath that is worn over the penis during sex. Condoms keep sperm from going inside a woman's body. They can be used with a spermicide to increase their effectiveness. They should be disposed after a single use. Female condom  A female condom is a soft, loose-fitting sheath that is put into the vagina before sex. The condom keeps sperm from going inside a woman's body. They should be disposed after a single use. Diaphragm  A diaphragm is a soft, dome-shaped barrier. It is inserted into the vagina before sex, along with a spermicide. The diaphragm blocks sperm from entering the uterus, and the spermicide kills sperm. A diaphragm should be left in the vagina for 6-8 hours after sex and removed within 24 hours. A diaphragm is prescribed and fitted by a health care provider. A diaphragm should be replaced every 1-2 years, after giving birth, after gaining more than 15 lb (6.8 kg), and after pelvic surgery. Cervical cap  A cervical cap is a round, soft latex or plastic cup that fits over the cervix. It is inserted into the vagina  before sex, along with spermicide. It blocks sperm from entering the uterus. The cap should be left in place for 6-8 hours after sex and removed within 48 hours. A cervical cap must be prescribed and fitted by a health care provider. It should be replaced every 2 years. Sponge  A sponge is a soft, circular piece of polyurethane foam with spermicide on it. The sponge helps block sperm from entering the uterus, and the spermicide kills sperm. To use it, you make it wet and then insert it into the vagina. It should be inserted before sex, left in for at least 6 hours after sex, and removed and thrown away within 30 hours. Spermicides Spermicides are chemicals that kill or block sperm from entering the cervix and uterus. They can come as a cream, jelly, suppository, foam, or tablet. A spermicide should be inserted into the vagina with an applicator at least 10-15 minutes before sex to allow time for it to work. The process must be repeated every time you have sex. Spermicides do not require a prescription. Intrauterine  contraception Intrauterine device (IUD) An IUD is a T-shaped device that is put in a woman's uterus. There are two types:  Hormone IUD.This type contains progestin, a synthetic form of the hormone progesterone. This type can stay in place for 3-5 years.  Copper IUD.This type is wrapped in copper wire. It can stay in place for 10 years.  Permanent methods of contraception Female tubal ligation In this method, a woman's fallopian tubes are sealed, tied, or blocked during surgery to prevent eggs from traveling to the uterus. Hysteroscopic sterilization In this method, a small, flexible insert is placed into each fallopian tube. The inserts cause scar tissue to form in the fallopian tubes and block them, so sperm cannot reach an egg. The procedure takes about 3 months to be effective. Another form of birth control must be used during those 3 months. Female sterilization This is a procedure  to tie off the tubes that carry sperm (vasectomy). After the procedure, the man can still ejaculate fluid (semen). Natural planning methods Natural family planning In this method, a couple does not have sex on days when the woman could become pregnant. Calendar method This means keeping track of the length of each menstrual cycle, identifying the days when pregnancy can happen, and not having sex on those days. Ovulation method In this method, a couple avoids sex during ovulation. Symptothermal method This method involves not having sex during ovulation. The woman typically checks for ovulation by watching changes in her temperature and in the consistency of cervical mucus. Post-ovulation method In this method, a couple waits to have sex until after ovulation. Summary  Contraception, also called birth control, means methods or devices that prevent pregnancy.  Hormonal methods of contraception include implants, injections, pills, patches, vaginal rings, and emergency contraceptives.  Barrier methods of contraception can include female condoms, female condoms, diaphragms, cervical caps, sponges, and spermicides.  There are two types of IUDs (intrauterine devices). An IUD can be put in a woman's uterus to prevent pregnancy for 3-5 years.  Permanent sterilization can be done through a procedure for males, females, or both.  Natural family planning methods involve not having sex on days when the woman could become pregnant. This information is not intended to replace advice given to you by your health care provider. Make sure you discuss any questions you have with your health care provider. Document Revised: 12/21/2016 Document Reviewed: 01/22/2016 Elsevier Patient Education  2020 [employer].

## 2020-04-22 ENCOUNTER — Other Ambulatory Visit: Payer: Self-pay

## 2020-04-22 ENCOUNTER — Other Ambulatory Visit (HOSPITAL_COMMUNITY)
Admission: RE | Admit: 2020-04-22 | Discharge: 2020-04-22 | Disposition: A | Discharge status: Home / Self Care | Payer: Medicaid Other | Source: Ambulatory Visit | Attending: Obstetrics and Gynecology | Admitting: Obstetrics and Gynecology

## 2020-04-22 ENCOUNTER — Encounter: Payer: Self-pay | Admitting: Obstetrics and Gynecology

## 2020-04-22 ENCOUNTER — Ambulatory Visit (INDEPENDENT_AMBULATORY_CARE_PROVIDER_SITE_OTHER): Payer: Self-pay | Admitting: Obstetrics and Gynecology

## 2020-04-22 VITALS — BP 108/69 | Wt 136.0 lb

## 2020-04-22 DIAGNOSIS — Z3480 Encounter for supervision of other normal pregnancy, unspecified trimester: Secondary | ICD-10-CM | POA: Diagnosis present

## 2020-04-22 DIAGNOSIS — Z3A17 17 weeks gestation of pregnancy: Secondary | ICD-10-CM

## 2020-04-22 DIAGNOSIS — Z113 Encounter for screening for infections with a predominantly sexual mode of transmission: Secondary | ICD-10-CM

## 2020-04-22 NOTE — Progress Notes (Signed)
New Obstetric Patient H&P   Chief Complaint: "Desires prenatal care"   History of Present Illness: Patient is a 25 y.o. G2P0010 Not Hispanic or Latino female, approximate LMP 01/06/2020 presents with amenorrhea and positive home pregnancy test. Based on her  LMP, her EDD is Estimated Date of Delivery: 10/12/2020. and her EGA is [redacted]w[redacted]d. Cycles are Her period is irregular coming about once every 2 months. Her last pap smear was in 11/2019 and was no abnormalities.    She had a urine pregnancy test which was positive 2 week(s)  ago. Since her LMP she claims she has experienced no issues. She denies vaginal bleeding. Her past medical history is noncontributory.   Since her LMP, she admits to the use of tobacco products  no She claims she has gained about 5 pounds since the start of her pregnancy.  There are cats in the home in the home  no She admits close contact with children on a regular basis  no  She has had chicken pox in the past no She has had Tuberculosis exposures, symptoms, or previously tested positive for TB   no Current or past history of domestic violence. no  Genetic Screening/Teratology Counseling: (Includes patient, baby's father, or anyone in either family with:)   1. Patient's age >/= 3 at [facility]  no 2. Thalassemia ([place], [place], Mediterranean, or Asian background): MCV<80  no 3. Neural tube defect (meningomyelocele, spina bifida, anencephaly)  no 4. Congenital heart defect  no  5. Down syndrome  no 6. Tay-Sachs (Jewish, [place]  no 7. Canavan's Disease  no 8. Sickle cell disease or trait (African)  no  9. Hemophilia or other blood disorders  no  10. Muscular dystrophy  no  11. Cystic fibrosis  no  12. Huntington's Chorea  no  13. Mental retardation/autism  no 14. Other inherited genetic or chromosomal disorder  no 15. Maternal metabolic disorder (DM, PKU, etc)  no 16. Patient or FOB with a child with a birth defect not listed above no  16a. Patient or FOB  with a birth defect themselves no 17. Recurrent pregnancy loss, or stillbirth  no  18. Any medications since LMP other than prenatal vitamins (include vitamins, supplements, OTC meds, drugs, alcohol)  no 19. Any other genetic/environmental exposure to discuss  no  Infection History:   1. Lives with someone with TB or TB exposed  no  2. Patient or partner has history of genital herpes  no 3. Rash or viral illness since LMP  no 4. History of STI (GC, CT, HPV, syphilis, HIV)  no 5. History of recent travel :  no  Other pertinent information:  no     Review of Systems:10 point review of systems negative unless otherwise noted in HPI  Past Medical History:  Diagnosis Date  . Miscarriage     Past Surgical History:  Procedure Laterality Date  . DILATION AND EVACUATION N/A 11/26/2019   Procedure: DILATATION AND EVACUATION;  Surgeon: Natale Milch, MD;  Location: [facility] ORS;  Service: Gynecology;  Laterality: N/A;  . INDUCED ABORTION      Gynecologic History: No LMP recorded (lmp unknown). Patient is pregnant.  Obstetric History: G2P0010  Family History  Problem Relation Age of Onset  . Thyroid disease Mother   . Hepatitis C Mother   . Healthy Father     Social History   Socioeconomic History  . Marital status: Single    Spouse name: Not on file  . Number of children:  Not on file  . Years of education: Not on file  . Highest education level: Not on file  Occupational History  . Not on file  Tobacco Use  . Smoking status: Never Smoker  . Smokeless tobacco: Never Used  Vaping Use  . Vaping Use: Former  Substance and Sexual Activity  . Alcohol use: No  . Drug use: Yes    Types: Marijuana  . Sexual activity: Yes    Birth control/protection: None  Other Topics Concern  . Not on file  Social History Narrative  . Not on file   Social Determinants of Health   Financial Resource Strain: Not on file  Food Insecurity: Not on file  Transportation Needs: Not on  file  Physical Activity: Not on file  Stress: Not on file  Social Connections: Not on file  Intimate Partner Violence: Not on file    Allergies  Allergen Reactions  . Amoxicillin Hives  . Penicillins Hives    Prior to Admission medications: PNV gummies     Physical Exam BP 108/69   Wt 136 lb (61.7 kg)   LMP  (LMP Unknown)   BMI 23.34 kg/m   Physical Exam Constitutional:      General: She is not in acute distress.    Appearance: Normal appearance. She is well-developed.  HENT:     Head: Normocephalic and atraumatic.  Eyes:     General: No scleral icterus.    Conjunctiva/sclera: Conjunctivae normal.  Cardiovascular:     Rate and Rhythm: Normal rate and regular rhythm.     Heart sounds: No murmur heard. No friction rub. No gallop.   Pulmonary:     Effort: Pulmonary effort is normal. No respiratory distress.     Breath sounds: Normal breath sounds. No wheezing or rales.  Abdominal:     General: Bowel sounds are normal. There is no distension.     Palpations: Abdomen is soft. There is no mass.     Tenderness: There is no abdominal tenderness. There is no guarding or rebound.  Musculoskeletal:        General: Normal range of motion.     Cervical back: Normal range of motion and neck supple.  Neurological:     General: No focal deficit present.     Mental Status: She is alert and oriented to person, place, and time.     Cranial Nerves: No cranial nerve deficit.  Skin:    General: Skin is warm and dry.     Findings: No erythema.  Psychiatric:        Mood and Affect: Mood normal.        Behavior: Behavior normal.        Judgment: Judgment normal.    Female Chaperone present during breast and/or pelvic exam.  Bedside transabdominal u/s for dating: SLIUP biometrics consistent with GA [redacted]w[redacted]d and Estimated Date of Delivery: 09/24/20    Assessment: 24 y.o. G2P0010 at [redacted]w[redacted]d presenting to initiate prenatal care  Plan: 1) Avoid alcoholic beverages. 2) Patient  encouraged not to smoke.  3) Discontinue the use of all non-medicinal drugs and chemicals.  4) Take prenatal vitamins daily.  5) Nutrition, food safety (fish, cheese advisories, and high nitrite foods) and exercise discussed. 6) Hospital and practice style discussed with cross coverage system.  7) Genetic Screening, such as with 1st Trimester Screening, cell free fetal DNA, AFP testing, and Ultrasound, as well as with amniocentesis and CVS as appropriate, is discussed with patient. At the conclusion of  today's visit patient undecided genetic testing 8) Patient is asked about travel to areas at risk for the Zika virus, and counseled to avoid travel and exposure to mosquitoes or sexual partners who may have themselves been exposed to the virus. Testing is discussed, and will be ordered as appropriate.  9) Awaiting insurance in order to get labs.   Thomasene Mohair, MD 04/22/2020 3:18 PM

## 2020-04-26 LAB — CERVICOVAGINAL ANCILLARY ONLY
Chlamydia: NEGATIVE
Comment: NEGATIVE
Comment: NORMAL
Neisseria Gonorrhea: NEGATIVE

## 2020-05-18 ENCOUNTER — Ambulatory Visit
Admission: RE | Admit: 2020-05-18 | Discharge: 2020-05-18 | Disposition: A | Discharge status: Home / Self Care | Payer: Medicaid Other | Source: Ambulatory Visit | Attending: Obstetrics and Gynecology | Admitting: Obstetrics and Gynecology

## 2020-05-18 ENCOUNTER — Other Ambulatory Visit: Payer: Self-pay

## 2020-05-18 DIAGNOSIS — Z3480 Encounter for supervision of other normal pregnancy, unspecified trimester: Secondary | ICD-10-CM | POA: Diagnosis not present

## 2020-05-19 ENCOUNTER — Ambulatory Visit (INDEPENDENT_AMBULATORY_CARE_PROVIDER_SITE_OTHER): Payer: Self-pay | Admitting: Obstetrics and Gynecology

## 2020-05-19 ENCOUNTER — Encounter: Payer: Self-pay | Admitting: Obstetrics and Gynecology

## 2020-05-19 VITALS — BP 114/75 | Wt 143.0 lb

## 2020-05-19 DIAGNOSIS — Z3A21 21 weeks gestation of pregnancy: Secondary | ICD-10-CM

## 2020-05-19 DIAGNOSIS — Z3482 Encounter for supervision of other normal pregnancy, second trimester: Secondary | ICD-10-CM

## 2020-05-19 LAB — POCT URINALYSIS DIPSTICK OB
Glucose, UA: NEGATIVE
POC,PROTEIN,UA: NEGATIVE

## 2020-05-19 NOTE — Progress Notes (Signed)
Routine Prenatal Care Visit  Subjective  Audrey Cruz is a 25 y.o. G2P0010 at [redacted]w[redacted]d being seen today for ongoing prenatal care.  She is currently monitored for the following issues for this low-risk pregnancy and has Retained products of conception following abortion; Supervision of other normal pregnancy, antepartum; and [redacted] weeks gestation of pregnancy on their problem list.  ----------------------------------------------------------------------------------- Patient reports no complaints.    . Vag. Bleeding: None.  Movement: Present. Leaking Fluid denies.  ----------------------------------------------------------------------------------- The following portions of the patient's history were reviewed and updated as appropriate: allergies, current medications, past family history, past medical history, past social history, past surgical history and problem list. Problem list updated.  Objective  Blood pressure 114/75, weight 143 lb (64.9 kg), last menstrual period 01/06/2020. Pregravid weight 136 lb (61.7 kg) Total Weight Gain 7 lb (3.175 kg) Urinalysis: Urine Protein Negative  Urine Glucose Negative  Fetal Status: Fetal Heart Rate (bpm): 145   Movement: Present     General:  Alert, oriented and cooperative. Patient is in no acute distress.  Skin: Skin is warm and dry. No rash noted.   Cardiovascular: Normal heart rate noted  Respiratory: Normal respiratory effort, no problems with respiration noted  Abdomen: Soft, gravid, appropriate for gestational age. Pain/Pressure: Absent     Pelvic:  Cervical exam deferred        Extremities: Normal range of motion.     Mental Status: Normal mood and affect. Normal behavior. Normal judgment and thought content.   Assessment   26 y.o. G2P0010 at [redacted]w[redacted]d by  09/24/2020, by Ultrasound presenting for routine prenatal visit  Plan   pregnancy Problems (from 04/22/20 to present)    Problem Noted Resolved   Supervision of other normal pregnancy,  antepartum 04/22/2020 by Conard Novak, MD No   Overview Signed 04/27/2020  7:28 AM by Conard Novak, MD    [facility]  Dating 17 wk u/s Blood type: --/--/A POS Performed at [facility], [address] [place], [place] [address]  [phone])   Genetic Screen 1 Screen:    AFP:     Quad:     NIPS: Antibody:NEG (11/23 0943)  Anatomic [place]  Rubella:   Varicella: @VZVIGG @  GTT Early:               Third trimester:  RPR:     Rhogam  HBsAg:     TDaP vaccine                       Flu Shot: HIV:     Baby Food                                GBS:   Contraception  Pap:  CBB     CS/VBAC    Support Person                Preterm labor symptoms and general obstetric precautions including but not limited to vaginal bleeding, contractions, leaking of fluid and fetal movement were reviewed in detail with the patient. Please refer to After Visit Summary for other counseling recommendations.   - Anatomy scan yesterday. Report not yet generated. - Still has not applied to [facility]. Recommend she get application done for important labs.   Return in about 4 weeks (around 06/16/2020) for Routine Prenatal Appointment.   06/18/2020, MD, [facility], [facility]  05/19/2020 2:23 PM

## 2020-06-10 ENCOUNTER — Encounter: Payer: Self-pay | Admitting: Obstetrics and Gynecology

## 2020-06-29 ENCOUNTER — Encounter: Payer: Self-pay | Admitting: Advanced Practice Midwife

## 2020-06-29 ENCOUNTER — Ambulatory Visit (INDEPENDENT_AMBULATORY_CARE_PROVIDER_SITE_OTHER): Payer: Self-pay | Admitting: Advanced Practice Midwife

## 2020-06-29 ENCOUNTER — Other Ambulatory Visit: Payer: Self-pay

## 2020-06-29 VITALS — BP 112/66 | Wt 161.0 lb

## 2020-06-29 DIAGNOSIS — Z3482 Encounter for supervision of other normal pregnancy, second trimester: Secondary | ICD-10-CM

## 2020-06-29 DIAGNOSIS — Z369 Encounter for antenatal screening, unspecified: Secondary | ICD-10-CM

## 2020-06-29 DIAGNOSIS — Z113 Encounter for screening for infections with a predominantly sexual mode of transmission: Secondary | ICD-10-CM

## 2020-06-29 DIAGNOSIS — Z131 Encounter for screening for diabetes mellitus: Secondary | ICD-10-CM

## 2020-06-29 DIAGNOSIS — Z3A27 27 weeks gestation of pregnancy: Secondary | ICD-10-CM

## 2020-06-29 DIAGNOSIS — Z130 Encounter for screening for diseases of the blood and blood-forming organs and certain disorders involving the immune mechanism: Secondary | ICD-10-CM

## 2020-06-29 LAB — POCT URINALYSIS DIPSTICK OB
Glucose, UA: NEGATIVE
POC,PROTEIN,UA: NEGATIVE

## 2020-06-29 NOTE — Patient Instructions (Signed)

## 2020-06-29 NOTE — Progress Notes (Signed)
ROB - no concerns. RM 5 

## 2020-06-29 NOTE — Progress Notes (Signed)
Routine Prenatal Care Visit  Subjective  Audrey Cruz is a 25 y.o. G2P0010 at [redacted]w[redacted]d being seen today for ongoing prenatal care.  She is currently monitored for the following issues for this low-risk pregnancy and has Supervision of other normal pregnancy, antepartum on their problem list.  ----------------------------------------------------------------------------------- Patient reports some bleeding of gums when brushing her teeth. Discussed as common during pregnancy and use a soft bristle brush. Can swish with diluted H2O2 if desired.   Contractions: Not present. Vag. Bleeding: None.  Movement: Present. Leaking Fluid denies.  ----------------------------------------------------------------------------------- The following portions of the patient's history were reviewed and updated as appropriate: allergies, current medications, past family history, past medical history, past social history, past surgical history and problem list. Problem list updated.  Objective  Blood pressure 112/66, weight 161 lb (73 kg), last menstrual period 01/06/2020. Pregravid weight 136 lb (61.7 kg) Total Weight Gain 25 lb (11.3 kg) Urinalysis: Urine Protein Negative  Urine Glucose Negative  Fetal Status: Fetal Heart Rate (bpm): 139 Fundal Height: 28 cm Movement: Present     General:  Alert, oriented and cooperative. Patient is in no acute distress.  Skin: Skin is warm and dry. No rash noted.   Cardiovascular: Normal heart rate noted  Respiratory: Normal respiratory effort, no problems with respiration noted  Abdomen: Soft, gravid, appropriate for gestational age. Pain/Pressure: Absent     Pelvic:  Cervical exam deferred        Extremities: Normal range of motion.  Edema: None  Mental Status: Normal mood and affect. Normal behavior. Normal judgment and thought content.   Assessment   25 y.o. G2P0010 at [redacted]w[redacted]d by  09/24/2020, by Ultrasound presenting for routine prenatal visit  Plan   pregnancy Problems  (from 04/22/20 to present)    Problem Noted Resolved   Supervision of other normal pregnancy, antepartum 04/22/2020 by Conard Novak, MD No   Overview Addendum 06/29/2020  4:17 PM by Tresea Mall, CNM    [facility]  Dating 17 wk u/s Blood type: --/--/A POS Performed at [facility], [address] [place], [place] [address]  [phone])   Genetic Screen 1 Screen:    AFP:     Quad:     NIPS: Antibody:NEG (11/23 0943)  Anatomic [place] 5/17 complete/normal Rubella:    Varicella: @VZVIGG @  GTT Early: NA                Third trimester:  RPR:     Rhogam  HBsAg:    Hep C:  Vaccines TDAP:                       Flu Shot: Covid: HIV:     Baby Food                                GBS:   GC/CT  Contraception  Pap:  CBB     CS/VBAC  Late prenatal care  Support Person                 Preterm labor symptoms and general obstetric precautions including but not limited to vaginal bleeding, contractions, leaking of fluid and fetal movement were reviewed in detail with the patient. Please refer to After Visit Summary for other counseling recommendations.   Return in about 2 weeks (around 07/13/2020) for 28 week labs and rob.  NOB labs including Hep C, urine culture, and  1 hr gtt at [facility], CNM 06/29/2020 4:21 PM

## 2020-07-14 ENCOUNTER — Encounter: Payer: Self-pay | Admitting: Advanced Practice Midwife

## 2020-07-14 ENCOUNTER — Other Ambulatory Visit: Payer: Self-pay

## 2020-07-19 ENCOUNTER — Other Ambulatory Visit: Payer: Medicaid Other

## 2020-07-19 ENCOUNTER — Ambulatory Visit (INDEPENDENT_AMBULATORY_CARE_PROVIDER_SITE_OTHER): Payer: Medicaid Other | Admitting: Obstetrics

## 2020-07-19 ENCOUNTER — Other Ambulatory Visit: Payer: Self-pay

## 2020-07-19 ENCOUNTER — Other Ambulatory Visit: Payer: Self-pay | Admitting: Advanced Practice Midwife

## 2020-07-19 VITALS — BP 110/66 | Wt 168.0 lb

## 2020-07-19 DIAGNOSIS — Z3480 Encounter for supervision of other normal pregnancy, unspecified trimester: Secondary | ICD-10-CM

## 2020-07-19 DIAGNOSIS — Z3A30 30 weeks gestation of pregnancy: Secondary | ICD-10-CM

## 2020-07-19 DIAGNOSIS — Z3482 Encounter for supervision of other normal pregnancy, second trimester: Secondary | ICD-10-CM

## 2020-07-19 DIAGNOSIS — Z130 Encounter for screening for diseases of the blood and blood-forming organs and certain disorders involving the immune mechanism: Secondary | ICD-10-CM

## 2020-07-19 DIAGNOSIS — Z369 Encounter for antenatal screening, unspecified: Secondary | ICD-10-CM

## 2020-07-19 DIAGNOSIS — Z113 Encounter for screening for infections with a predominantly sexual mode of transmission: Secondary | ICD-10-CM

## 2020-07-19 DIAGNOSIS — Z131 Encounter for screening for diabetes mellitus: Secondary | ICD-10-CM

## 2020-07-19 LAB — POCT URINALYSIS DIPSTICK OB
Glucose, UA: NEGATIVE
POC,PROTEIN,UA: NEGATIVE

## 2020-07-19 NOTE — Progress Notes (Signed)
  Routine Prenatal Care Visit  Subjective  Audrey Cruz is a 25 y.o. G2P0010 at [redacted]w[redacted]d being seen today for ongoing prenatal care.  She is currently monitored for the following issues for this low-risk pregnancy and has Supervision of other normal pregnancy, antepartum on their problem list.  ----------------------------------------------------------------------------------- Patient reports no complaints.    .  .   Audrey Cruz denies.  ----------------------------------------------------------------------------------- The following portions of the patient's history were reviewed and updated as appropriate: allergies, current medications, past family history, past medical history, past social history, past surgical history and problem list. Problem list updated.  Objective  Last menstrual period 01/06/2020. Pregravid weight 136 lb (61.7 kg) Total Weight Gain 25 lb (11.3 kg) Urinalysis: Urine Protein    Urine Glucose    Fetal Status:           General:  Alert, oriented and cooperative. Patient is in no acute distress.  Skin: Skin is warm and dry. No rash noted.   Cardiovascular: Normal heart rate noted  Respiratory: Normal respiratory effort, no problems with respiration noted  Abdomen: Soft, gravid, appropriate for gestational age.       Pelvic:  Cervical exam deferred        Extremities: Normal range of motion.     Mental Status: Normal mood and affect. Normal behavior. Normal judgment and thought content.   Assessment   25 y.o. G2P0010 at [redacted]w[redacted]d by  09/24/2020, by Ultrasound presenting for routine prenatal visit  Plan   pregnancy Problems (from 04/22/20 to present)     Problem Noted Resolved   Supervision of other normal pregnancy, antepartum 04/22/2020 by Audrey Novak, MD No   Overview Addendum 06/29/2020  4:17 PM by Audrey Cruz, CNM    [facility]  Dating 17 wk u/s Blood type: --/--/A POS Performed at [facility], [address]  [place], [place] [address]  [phone])   Genetic Screen 1 Screen:    AFP:     Quad:     NIPS: Antibody:NEG (11/23 0943)  Anatomic [place] 5/17 complete/normal Rubella:    Varicella: @VZVIGG @  GTT Early: NA                Third trimester:  RPR:     Rhogam  HBsAg:    Hep C:  Vaccines TDAP:                       Flu Shot: Covid: HIV:     Baby Food                                GBS:   GC/CT  Contraception  Pap:  CBB     CS/VBAC  Late prenatal care  Support Person                 Preterm labor symptoms and general obstetric precautions including but not limited to vaginal bleeding, contractions, leaking of Cruz and fetal movement were reviewed in detail with the patient. Please refer to After Visit Summary for other counseling recommendations.   Return in about 2 weeks (around 08/02/2020) for return OB.  10/02/2020, CNM  07/19/2020 9:54 AM

## 2020-07-19 NOTE — Progress Notes (Signed)
ROB - 1 hr gtt, Lt side rib pain. RM 5

## 2020-07-20 LAB — RPR+RH+ABO+RUB AB+AB SCR+CB...
Antibody Screen: NEGATIVE
HIV Screen 4th Generation wRfx: NONREACTIVE
Hematocrit: 29.1 % — ABNORMAL LOW (ref 34.0–46.6)
Hemoglobin: 9.4 g/dL — ABNORMAL LOW (ref 11.1–15.9)
Hepatitis B Surface Ag: NEGATIVE
MCH: 27 pg (ref 26.6–33.0)
MCHC: 32.3 g/dL (ref 31.5–35.7)
MCV: 84 fL (ref 79–97)
Platelets: 288 10*3/uL (ref 150–450)
RBC: 3.48 x10E6/uL — ABNORMAL LOW (ref 3.77–5.28)
RDW: 13.6 % (ref 11.7–15.4)
RPR Ser Ql: NONREACTIVE
Rh Factor: POSITIVE
Rubella Antibodies, IGG: 1.71 index (ref >0.99)
Varicella zoster IgG: 228 index (ref >165)
WBC: 15.6 10*3/uL — ABNORMAL HIGH (ref 3.4–10.8)

## 2020-07-20 LAB — GLUCOSE, 1 HOUR GESTATIONAL: Gestational Diabetes Screen: 153 mg/dL — ABNORMAL HIGH (ref 65–139)

## 2020-07-20 LAB — HEPATITIS C ANTIBODY: Hep C Virus Ab: 0.2 s/co ratio (ref 0.0–0.9)

## 2020-07-25 ENCOUNTER — Other Ambulatory Visit: Payer: Self-pay | Admitting: Advanced Practice Midwife

## 2020-07-25 DIAGNOSIS — Z3480 Encounter for supervision of other normal pregnancy, unspecified trimester: Secondary | ICD-10-CM

## 2020-07-25 DIAGNOSIS — R7309 Other abnormal glucose: Secondary | ICD-10-CM

## 2020-07-25 NOTE — Progress Notes (Signed)
3h gtt ordered. Message to patient and to front desk to call patient to schedule test.

## 2020-07-26 ENCOUNTER — Telehealth: Payer: Self-pay

## 2020-07-26 NOTE — Telephone Encounter (Signed)
Patient is scheduled for 08/05/20

## 2020-07-26 NOTE — Telephone Encounter (Signed)
-----   Message from Tresea Mall, CNM sent at 07/25/2020  2:44 PM EDT ----- Please call the patient to schedule a 3 hour glucose tolerance test- lab-only visit.

## 2020-07-26 NOTE — Telephone Encounter (Signed)
Called and left voicemail for patient to call back to be scheduled. 

## 2020-08-03 ENCOUNTER — Encounter: Payer: Medicaid Other | Admitting: Obstetrics

## 2020-08-05 ENCOUNTER — Ambulatory Visit (INDEPENDENT_AMBULATORY_CARE_PROVIDER_SITE_OTHER): Payer: Medicaid Other | Admitting: Obstetrics

## 2020-08-05 ENCOUNTER — Other Ambulatory Visit: Payer: Self-pay

## 2020-08-05 ENCOUNTER — Other Ambulatory Visit: Payer: Medicaid Other

## 2020-08-05 VITALS — BP 120/80 | Wt 171.0 lb

## 2020-08-05 DIAGNOSIS — Z369 Encounter for antenatal screening, unspecified: Secondary | ICD-10-CM

## 2020-08-05 DIAGNOSIS — Z3480 Encounter for supervision of other normal pregnancy, unspecified trimester: Secondary | ICD-10-CM

## 2020-08-05 DIAGNOSIS — Z3482 Encounter for supervision of other normal pregnancy, second trimester: Secondary | ICD-10-CM

## 2020-08-05 DIAGNOSIS — Z3A32 32 weeks gestation of pregnancy: Secondary | ICD-10-CM

## 2020-08-05 NOTE — Progress Notes (Signed)
Routine Prenatal Care Visit  Subjective  Audrey Cruz is a 25 y.o. G2P0010 at [redacted]w[redacted]d being seen today for ongoing prenatal care.  She is currently monitored for the following issues for this low-risk pregnancy and has Supervision of other normal pregnancy, antepartum on their problem list.  ----------------------------------------------------------------------------------- Patient reports no complaints.  She missed her three hour GTT this morning- slept in. Has it set up for tomorrow Contractions: Not present. Vag. Bleeding: None.  Movement: Present. Leaking Fluid denies.  ----------------------------------------------------------------------------------- The following portions of the patient's history were reviewed and updated as appropriate: allergies, current medications, past family history, past medical history, past social history, past surgical history and problem list. Problem list updated.  Objective  Blood pressure 120/80, weight 171 lb (77.6 kg), last menstrual period 01/06/2020. Pregravid weight 136 lb (61.7 kg) Total Weight Gain 35 lb (15.9 kg) Urinalysis: Urine Protein    Urine Glucose    Fetal Status:     Movement: Present     General:  Alert, oriented and cooperative. Patient is in no acute distress.  Skin: Skin is warm and dry. No rash noted.   Cardiovascular: Normal heart rate noted  Respiratory: Normal respiratory effort, no problems with respiration noted  Abdomen: Soft, gravid, appropriate for gestational age. Pain/Pressure: Present     Pelvic:  Cervical exam deferred        Extremities: Normal range of motion.     Mental Status: Normal mood and affect. Normal behavior. Normal judgment and thought content.   Assessment   25 y.o. G2P0010 at [redacted]w[redacted]d by  09/24/2020, by Ultrasound presenting for routine prenatal visit  Plan   pregnancy Problems (from 04/22/20 to present)    Problem Noted Resolved   Supervision of other normal pregnancy, antepartum 04/22/2020 by  Conard Novak, MD No   Overview Addendum 08/05/2020  9:50 AM by Mirna Mires, CNM    [facility]  Dating 17 wk u/s Blood type: --/--/A POS Performed at [facility], [address] [place], [place] [address]  [phone])   Genetic Screen 1 Screen:    AFP:     Quad:     NIPS: Antibody:NEG (11/23 0943)  Anatomic [place] 5/17 complete/normal Rubella:    Varicella: @VZVIGG @  GTT Early: NA                Third trimester: 153. 3 hr: RPR:     Rhogam  HBsAg:    Hep C:  Vaccines TDAP:                       Flu Shot: Covid: HIV:     Baby Food                                GBS:   GC/CT  Contraception  Pap:  CBB     CS/VBAC  Late prenatal care  Support Person                Preterm labor symptoms and general obstetric precautions including but not limited to vaginal bleeding, contractions, leaking of fluid and fetal movement were reviewed in detail with the patient. Please refer to After Visit Summary for other counseling recommendations.   Return in about 1 week (around 08/12/2020) for return OB, missed three hour GTT- Needs this done ASAP. Set up for tomorrow.   10/12/2020, CNM  08/05/2020 9:57 AM

## 2020-08-06 ENCOUNTER — Other Ambulatory Visit: Payer: Medicaid Other

## 2020-08-06 DIAGNOSIS — R7309 Other abnormal glucose: Secondary | ICD-10-CM

## 2020-08-06 DIAGNOSIS — Z3480 Encounter for supervision of other normal pregnancy, unspecified trimester: Secondary | ICD-10-CM

## 2020-08-10 ENCOUNTER — Ambulatory Visit (INDEPENDENT_AMBULATORY_CARE_PROVIDER_SITE_OTHER): Payer: Medicaid Other | Admitting: Obstetrics

## 2020-08-10 ENCOUNTER — Other Ambulatory Visit: Payer: Self-pay

## 2020-08-10 VITALS — BP 100/60 | Wt 172.0 lb

## 2020-08-10 DIAGNOSIS — Z3A33 33 weeks gestation of pregnancy: Secondary | ICD-10-CM

## 2020-08-10 DIAGNOSIS — R8271 Bacteriuria: Secondary | ICD-10-CM

## 2020-08-10 DIAGNOSIS — Z3480 Encounter for supervision of other normal pregnancy, unspecified trimester: Secondary | ICD-10-CM

## 2020-08-10 LAB — POCT URINALYSIS DIPSTICK OB
Bilirubin, UA: NEGATIVE
Blood, UA: NEGATIVE
Glucose, UA: NEGATIVE
Ketones, UA: NEGATIVE
Nitrite, UA: NEGATIVE
Spec Grav, UA: 1.01 (ref 1.010–1.025)
Urobilinogen, UA: 0.2 E.U./dL
pH, UA: 8 (ref 5.0–8.0)

## 2020-08-10 LAB — URINE CULTURE

## 2020-08-10 MED ORDER — NITROFURANTOIN MONOHYD MACRO 100 MG PO CAPS: 100.0000 mg | ORAL_CAPSULE | Freq: Two times a day (BID) | ORAL | 1 refills | Status: DC

## 2020-08-10 NOTE — Progress Notes (Signed)
  Routine Prenatal Care Visit  Subjective  Audrey Cruz is a 25 y.o. G2P0010 at [redacted]w[redacted]d being seen today for ongoing prenatal care.  She is currently monitored for the following issues for this low-risk pregnancy and has Supervision of other normal pregnancy, antepartum on their problem list.  ----------------------------------------------------------------------------------- Patient reports no complaints.    .  .   [name] denies.  ----------------------------------------------------------------------------------- The following portions of the patient's history were reviewed and updated as appropriate: allergies, current medications, past family history, past medical history, past social history, past surgical history and problem list. Problem list updated.  Objective  Blood pressure 100/60, weight 172 lb (78 kg), last menstrual period 01/06/2020. Pregravid weight 136 lb (61.7 kg) Total Weight Gain 36 lb (16.3 kg) Urinalysis: Urine Protein    Urine Glucose    Fetal Status:           General:  Alert, oriented and cooperative. Patient is in no acute distress.  Skin: Skin is warm and dry. No rash noted.   Cardiovascular: Normal heart rate noted  Respiratory: Normal respiratory effort, no problems with respiration noted  Abdomen: Soft, gravid, appropriate for gestational age.       Pelvic:  Cervical exam deferred        Extremities: Normal range of motion.     Mental Status: Normal mood and affect. Normal behavior. Normal judgment and thought content.   Assessment   25 y.o. G2P0010 at [redacted]w[redacted]d by  09/24/2020, by Ultrasound presenting for routine prenatal visit  Plan   pregnancy Problems (from 04/22/20 to present)     Problem Noted Resolved   Supervision of other normal pregnancy, antepartum 04/22/2020 by Conard Novak, MD No   Overview Addendum 08/05/2020  9:50 AM by Mirna Mires, CNM    [facility]  Dating 17 wk u/s Blood type: --/--/A POS Performed  at [facility], [address] [place], [place] [address]  [phone])   Genetic Screen 1 Screen:    AFP:     Quad:     NIPS: Antibody:NEG (11/23 0943)  Anatomic [place] 5/17 complete/normal Rubella:    Varicella: @VZVIGG @  GTT Early: NA                Third trimester: 153. 3 hr: RPR:     Rhogam  HBsAg:    Hep C:  Vaccines TDAP:                       Flu Shot: Covid: HIV:     Baby Food                                GBS:   GC/CT  Contraception  Pap:  CBB     CS/VBAC  Late prenatal care  Support Person                 Preterm labor symptoms and general obstetric precautions including but not limited to vaginal bleeding, contractions, leaking of fluid and fetal movement were reviewed in detail with the patient. Please refer to After Visit Summary for other counseling recommendations.   Return in about 2 weeks (around 08/24/2020) for return OB.  08/26/2020, CNM  08/10/2020 3:24 PM

## 2020-08-10 NOTE — Progress Notes (Signed)
ROB- rib pain for the past week

## 2020-08-10 NOTE — Addendum Note (Signed)
Addended by: Donnetta Hail on: 08/10/2020 03:48 PM   Modules accepted: Orders

## 2020-08-13 ENCOUNTER — Other Ambulatory Visit: Payer: Self-pay | Admitting: Advanced Practice Midwife

## 2020-08-24 ENCOUNTER — Ambulatory Visit (INDEPENDENT_AMBULATORY_CARE_PROVIDER_SITE_OTHER): Payer: Medicaid Other | Admitting: Advanced Practice Midwife

## 2020-08-24 ENCOUNTER — Other Ambulatory Visit: Payer: Self-pay | Admitting: Advanced Practice Midwife

## 2020-08-24 ENCOUNTER — Other Ambulatory Visit: Payer: Self-pay

## 2020-08-24 ENCOUNTER — Encounter: Payer: Self-pay | Admitting: Advanced Practice Midwife

## 2020-08-24 VITALS — BP 116/70 | Ht 66.0 in | Wt 178.0 lb

## 2020-08-24 DIAGNOSIS — Z3A35 35 weeks gestation of pregnancy: Secondary | ICD-10-CM

## 2020-08-24 DIAGNOSIS — R7309 Other abnormal glucose: Secondary | ICD-10-CM

## 2020-08-24 DIAGNOSIS — O99810 Abnormal glucose complicating pregnancy: Secondary | ICD-10-CM

## 2020-08-24 DIAGNOSIS — Z3483 Encounter for supervision of other normal pregnancy, third trimester: Secondary | ICD-10-CM

## 2020-08-24 DIAGNOSIS — Z3480 Encounter for supervision of other normal pregnancy, unspecified trimester: Secondary | ICD-10-CM

## 2020-08-24 LAB — POCT URINALYSIS DIPSTICK OB: POC,PROTEIN,UA: NEGATIVE

## 2020-08-24 MED ORDER — ACCU-CHEK NANO SMARTVIEW W/DEVICE KIT: 1.0000 | PACK | 0 refills | Status: DC

## 2020-08-24 MED ORDER — ACCU-CHEK SOFTCLIX LANCETS MISC: 12 refills | Status: DC

## 2020-08-24 MED ORDER — ACCU-CHEK SMARTVIEW VI STRP: ORAL_STRIP | 12 refills | Status: DC

## 2020-08-24 NOTE — Progress Notes (Signed)
Recent [employer] error with collection of specimens- reorder of 3 hr gtt- will discuss alternatives with patient given late gestational age prior to repeating test.

## 2020-08-24 NOTE — Progress Notes (Signed)
Routine Prenatal Care Visit  Subjective  Audrey Cruz is a 25 y.o. G2P0010 at [redacted]w[redacted]d being seen today for ongoing prenatal care.  She is currently monitored for the following issues for this low-risk pregnancy and has Supervision of other normal pregnancy, antepartum on their problem list.  ----------------------------------------------------------------------------------- Patient reports no complaints.   Contractions: Not present. Vag. Bleeding: None.  Movement: Present. Leaking Fluid denies.  ----------------------------------------------------------------------------------- The following portions of the patient's history were reviewed and updated as appropriate: allergies, current medications, past family history, past medical history, past social history, past surgical history and problem list. Problem list updated.  Objective  Blood pressure 116/70, height 5\' 6"  (1.676 m), weight 178 lb (80.7 kg), last menstrual period 01/06/2020. Pregravid weight 136 lb (61.7 kg) Total Weight Gain 42 lb (19.1 kg) Urinalysis: Urine Protein    Urine Glucose    Fetal Status: Fetal Heart Rate (bpm): 137 Fundal Height: 35 cm Movement: Present     General:  Alert, oriented and cooperative. Patient is in no acute distress.  Skin: Skin is warm and dry. No rash noted.   Cardiovascular: Normal heart rate noted  Respiratory: Normal respiratory effort, no problems with respiration noted  Abdomen: Soft, gravid, appropriate for gestational age. Pain/Pressure: Present     Pelvic:  Cervical exam deferred        Extremities: Normal range of motion.  Edema: None  Mental Status: Normal mood and affect. Normal behavior. Normal judgment and thought content.   Assessment   25 y.o. G2P0010 at [redacted]w[redacted]d by  09/24/2020, by Ultrasound presenting for routine prenatal visit  Plan   pregnancy Problems (from 04/22/20 to present)    Problem Noted Resolved   Supervision of other normal pregnancy, antepartum 04/22/2020 by  04/24/2020, MD No   Overview Addendum 08/05/2020  9:50 AM by 10/05/2020, CNM    [facility]  Dating 17 wk u/s Blood type: --/--/A POS Performed at [facility], [address] [place], [place]  [phone])   Genetic Screen 1 Screen:    AFP:     Quad:     NIPS: Antibody:NEG (11/23 0943)  Anatomic [birth year]1995 5/17 complete/normal Rubella:    Varicella: @VZVIGG @  GTT Early: NA                Third trimester: 153. 3 hr: RPR:     Rhogam  HBsAg:    Hep C:  Vaccines TDAP:                       Flu Shot: Covid: HIV:     Baby Food                                GBS:   GC/CT  Contraception  Pap:  CBB     CS/VBAC  Late prenatal care  Support Person                Preterm labor symptoms and general obstetric precautions including but not limited to vaginal bleeding, contractions, leaking of fluid and fetal movement were reviewed in detail with the patient.  Accuracy of 3 hr gtt uncertain due to 6/17 error (discussed plan with AMS) Glucometer and supplies ordered to check 4 times daily assuming GDM Growth scan ordered  Return in about 1 week (around 08/31/2020) for growth scan and rob x4.  [employer], CNM 08/24/2020  3:31 PM

## 2020-08-28 LAB — GESTATIONAL GLUCOSE TOLERANCE

## 2020-09-07 ENCOUNTER — Other Ambulatory Visit (HOSPITAL_COMMUNITY)
Admission: RE | Admit: 2020-09-07 | Discharge: 2020-09-07 | Disposition: A | Discharge status: Home / Self Care | Payer: Medicaid Other | Source: Ambulatory Visit | Attending: Advanced Practice Midwife | Admitting: Advanced Practice Midwife

## 2020-09-07 ENCOUNTER — Ambulatory Visit (INDEPENDENT_AMBULATORY_CARE_PROVIDER_SITE_OTHER): Payer: Medicaid Other | Admitting: Advanced Practice Midwife

## 2020-09-07 ENCOUNTER — Encounter: Payer: Self-pay | Admitting: Advanced Practice Midwife

## 2020-09-07 ENCOUNTER — Other Ambulatory Visit: Payer: Self-pay

## 2020-09-07 VITALS — BP 120/70 | Wt 180.0 lb

## 2020-09-07 DIAGNOSIS — Z3685 Encounter for antenatal screening for Streptococcus B: Secondary | ICD-10-CM

## 2020-09-07 DIAGNOSIS — Z3483 Encounter for supervision of other normal pregnancy, third trimester: Secondary | ICD-10-CM | POA: Insufficient documentation

## 2020-09-07 DIAGNOSIS — Z113 Encounter for screening for infections with a predominantly sexual mode of transmission: Secondary | ICD-10-CM | POA: Diagnosis present

## 2020-09-07 DIAGNOSIS — Z369 Encounter for antenatal screening, unspecified: Secondary | ICD-10-CM

## 2020-09-07 DIAGNOSIS — Z3A37 37 weeks gestation of pregnancy: Secondary | ICD-10-CM

## 2020-09-07 LAB — POCT URINALYSIS DIPSTICK OB
Glucose, UA: NEGATIVE
POC,PROTEIN,UA: NEGATIVE

## 2020-09-07 LAB — OB RESULTS CONSOLE GC/CHLAMYDIA: Gonorrhea: NEGATIVE

## 2020-09-07 NOTE — Progress Notes (Signed)
Routine Prenatal Care Visit  Subjective  Audrey Cruz is a 25 y.o. G2P0010 at [redacted]w[redacted]d being seen today for ongoing prenatal care.  She is currently monitored for the following issues for this low-risk pregnancy and has Supervision of other normal pregnancy, antepartum on their problem list.  ----------------------------------------------------------------------------------- Patient reports no complaints.  Blood sugar log is incomplete and primarily normal results Contractions: Not present. Vag. Bleeding: None.  Movement: Present. Leaking Fluid denies.  ----------------------------------------------------------------------------------- The following portions of the patient's history were reviewed and updated as appropriate: allergies, current medications, past family history, past medical history, past social history, past surgical history and problem list. Problem list updated.  Objective  Blood pressure 120/70, weight 180 lb (81.6 kg), last menstrual period 01/06/2020. Pregravid weight 136 lb (61.7 kg) Total Weight Gain 44 lb (20 kg) Urinalysis: Urine Protein    Urine Glucose    Fetal Status: Fetal Heart Rate (bpm): 140 Fundal Height: 37 cm Movement: Present      BS log: fasting: all normal After meals: less than half elevated in 120s up to 140  General:  Alert, oriented and cooperative. Patient is in no acute distress.  Skin: Skin is warm and dry. No rash noted.   Cardiovascular: Normal heart rate noted  Respiratory: Normal respiratory effort, no problems with respiration noted  Abdomen: Soft, gravid, appropriate for gestational age. Pain/Pressure: Absent     Pelvic:  Cervical exam deferred      GBS/aptima collected  Extremities: Normal range of motion.     Mental Status: Normal mood and affect. Normal behavior. Normal judgment and thought content.   Assessment   25 y.o. G2P0010 at [redacted]w[redacted]d by  09/24/2020, by Ultrasound presenting for routine prenatal visit  Plan   pregnancy  Problems (from 04/22/20 to present)    Problem Noted Resolved   Supervision of other normal pregnancy, antepartum 04/22/2020 by Conard Novak, MD No   Overview Addendum 09/07/2020  2:42 PM by Tresea Mall, CNM    [facility]  Dating 17 wk u/s Blood type: --/--/A POS Performed at [facility], [address] [place], [place] [address]  [phone])   Genetic Screen 1 Screen:    AFP:     Quad:     NIPS: Antibody:NEG (11/23 0943)  Anatomic [place] 5/17 complete/normal Rubella:    Varicella: @VZVIGG @  GTT Early: NA                Third trimester: 153. 3 hr: failed, however, unsubstantiated due to [employer] error- patient checking blood sugar at home RPR:     Rhogam  HBsAg:    Hep C:  Vaccines TDAP:                       Flu Shot: Covid: HIV:     Baby Food                                GBS:   GC/CT  Contraception  Pap:  CBB     CS/VBAC  Late prenatal care  Support Person                Term labor symptoms and general obstetric precautions including but not limited to vaginal bleeding, contractions, leaking of fluid and fetal movement were reviewed in detail with the patient. Please refer to After Visit Summary for other counseling recommendations.   Return for  scheduled follow up appointments.  Tresea Mall, CNM 09/07/2020 3:01 PM

## 2020-09-08 ENCOUNTER — Ambulatory Visit
Admission: RE | Admit: 2020-09-08 | Discharge: 2020-09-08 | Disposition: A | Discharge status: Home / Self Care | Payer: Medicaid Other | Source: Ambulatory Visit | Attending: Advanced Practice Midwife | Admitting: Advanced Practice Midwife

## 2020-09-08 DIAGNOSIS — Z3483 Encounter for supervision of other normal pregnancy, third trimester: Secondary | ICD-10-CM | POA: Insufficient documentation

## 2020-09-08 DIAGNOSIS — O99810 Abnormal glucose complicating pregnancy: Secondary | ICD-10-CM | POA: Insufficient documentation

## 2020-09-09 LAB — CERVICOVAGINAL ANCILLARY ONLY
Chlamydia: NEGATIVE
Comment: NEGATIVE
Comment: NEGATIVE
Comment: NORMAL
Neisseria Gonorrhea: NEGATIVE
Trichomonas: NEGATIVE

## 2020-09-11 LAB — STREP GP B CULTURE+RFLX: Strep Gp B Culture+Rflx: NEGATIVE

## 2020-09-14 ENCOUNTER — Other Ambulatory Visit: Payer: Self-pay

## 2020-09-14 ENCOUNTER — Encounter: Payer: Self-pay | Admitting: Obstetrics & Gynecology

## 2020-09-14 ENCOUNTER — Ambulatory Visit (INDEPENDENT_AMBULATORY_CARE_PROVIDER_SITE_OTHER): Payer: Medicaid Other | Admitting: Obstetrics & Gynecology

## 2020-09-14 VITALS — BP 100/70 | Wt 179.0 lb

## 2020-09-14 DIAGNOSIS — Z3483 Encounter for supervision of other normal pregnancy, third trimester: Secondary | ICD-10-CM

## 2020-09-14 DIAGNOSIS — Z3480 Encounter for supervision of other normal pregnancy, unspecified trimester: Secondary | ICD-10-CM

## 2020-09-14 DIAGNOSIS — Z3A38 38 weeks gestation of pregnancy: Secondary | ICD-10-CM

## 2020-09-14 LAB — POCT URINALYSIS DIPSTICK OB
Glucose, UA: NEGATIVE
POC,PROTEIN,UA: NEGATIVE

## 2020-09-14 NOTE — Patient Instructions (Signed)
First Stage of Labor Labor is your body's natural process of moving your baby and other structures, including the placenta and umbilical cord, out of your uterus. There are three stages of labor. How long each stage lasts is different for every woman. But certain events happen during each stage that are the same for everyone. The first stage starts when true labor begins. This stage ends when your cervix, which is the opening from your uterus into your vagina, is completely open (dilated). The second stage begins when your cervix is fully dilated and you start pushing. This stage ends when your baby is born. The third stage is the delivery of the organ that nourished your baby during pregnancy (placenta). First stage of labor As your due date gets closer, you may start to notice certain physical changes that mean labor is going to start soon. You may feel that your baby has dropped lower into your pelvis. You may experience irregular, often painless, contractions that go away when you walk around or lie down ([employer] contractions). This is also called false labor. The first stage of labor begins when you start having contractions that come at regular (evenly spaced) intervals and your cervix starts to get thinner and wider in preparation for your baby to pass through. Birth care providers measure the dilation of your cervix in centimeters (cm). One centimeter is a little less than one-half of an inch. The first stage ends when your cervix is dilated to 10 cm. The first stage of labor is divided into three phases: Early phase. Active phase. Transitional phase. The length of the first stage of labor varies. It may be longer if this is your first pregnancy. You may spend most of this stage at home trying to relax and stay comfortable. How does this affect me? During the first stage of labor, you will move through three phases. What happens in the early phase? You will start to have regular  contractions that last 30-60 seconds. Contractions may come every 5-20 minutes. Keep track of your contractions and call your birth care provider. Your water may break during this phase. You may notice a clear or slightly bloody discharge of mucus (mucus plug) from your vagina. Your cervix will dilate to 3-6 cm. What happens in the active phase? The active phase usually lasts 3-5 hours. You may go to the hospital or birth center around this time. During the active phase: Your contractions will become stronger, longer, and more uncomfortable. Your contractions may last 45-90 seconds and come every 3-5 minutes. You may feel lower back pain. Your birth care providers may examine your cervix and feel your belly to find the position of your baby. You may have a monitor strapped to your belly to measure your contractions and your baby's heart rate. You may start using your pain management options. Your cervix may be dilated to 6 cm and may start to dilate more quickly. What happens in the transitional phase? The transitional phase typically lasts from 30 minutes to 2 hours. At the end of this phase, your cervix will be fully dilated to 10 cm. During the transitional phase: Contractions will get stronger and longer. Contractions may last 60-90 seconds and come less than 2 minutes apart. You may feel hot flashes, chills, or nausea. How does this affect my baby? During the first stage of labor, your baby will gradually move down into your birth canal. Follow these instructions at home and in the hospital or birth center:  When labor first begins, try to stay calm. You are still in the early phase. If it is night, try to get some sleep. If it is day, try to relax and save your energy. You may want to make some calls and get ready to go to the hospital or birth center. When you are in the early phase, try these methods to help ease discomfort: Deep breathing and muscle relaxation. Taking a  walk. Taking a warm bath or shower. Drink some fluids and have a light snack if you feel like it. Keep track of your contractions. Based on the plan you created with your birth care provider, call when your contractions indicate it is time. If your water breaks, note the time, color, and odor of the fluid. When you are in the active phase, do your breathing exercises and rely on your support people and your team of birth care providers. Contact a health care provider if: Your contractions are strong and regular. You have lower back pain or cramping. Your water breaks. You lose your mucus plug. Get help right away if you: Have a severe headache that does not go away. Have changes in your vision. Have severe pain in your upper belly. Do not feel the baby move. Have bright red bleeding. Summary The first stage of labor starts when true labor begins, and it ends when your cervix is dilated to 10 cm. The first stage of labor has three phases: early, active, and transitional. Your baby moves into the birth canal during the first stage of labor. You may have contractions that become stronger and longer. You may also lose your mucus plug and have your water break. Call your birth care provider when your contractions are frequent and strong enough to go to the hospital or birth center. This information is not intended to replace advice given to you by your health care provider. Make sure you discuss any questions you have with your health care provider. Document Revised: 04/11/2018 Document Reviewed: 03/04/2017 Elsevier Patient Education  2022 Elsevier Inc.  

## 2020-09-14 NOTE — Addendum Note (Signed)
Addended by: Cornelius Moras D on: 09/14/2020 02:48 PM   Modules accepted: Orders

## 2020-09-14 NOTE — Progress Notes (Signed)
  Subjective  Fetal Movement? yes Contractions? Occas BHs Leaking Fluid? no Vaginal Bleeding? no  Objective  BP 100/70   Wt 179 lb (81.2 kg)   LMP 01/06/2020 (Approximate)   BMI 28.89 kg/m  General: NAD Pumonary: no increased work of breathing Abdomen: gravid, non-tender Extremities: no edema Psychiatric: mood appropriate, affect full  Assessment  25 y.o. G2P0010 at [redacted]w[redacted]d by  09/24/2020, by Ultrasound presenting for routine prenatal visit  Plan   Problem List Items Addressed This Visit      Other   Supervision of other normal pregnancy, antepartum  Other Visit Diagnoses    Encounter for supervision of other normal pregnancy in third trimester    -  Primary   [redacted] weeks gestation of pregnancy          pregnancy Problems (from 04/22/20 to present)    Problem Noted Resolved   Supervision of other normal pregnancy, antepartum 04/22/2020 by Conard Novak, MD No   Overview Addendum 09/14/2020  2:43 PM by Nadara Mustard, MD    [facility] Prenatal Labs  Dating 17 wk u/s Blood type: A/Positive/-- (07/18 1022)   Genetic Screen None Antibody:Negative (07/18 1022)  Anatomic [place] 5/17 complete/normal Rubella: 1.71 (07/18 1022)  Varicella: Immune  GTT Early: NA                Third trimester: 153. 3 hr: failed, however, unsubstantiated due to [employer] error- patient checking blood sugar at home RPR: Non Reactive (07/18 1022)   Rhogam n/a HBsAg: Negative (07/18 1022)  Hep C:  Vaccines TDAP:    decl                   Flu Shot:  no Covid:  no HIV: Non Reactive (07/18 1022)   Baby Food         Breast                       ZNB:VAPOLIDC/-- (09/06 1456)  GC/CT  Contraception         Depo (vs Nexplanon) Pap:11/20/19  CBB  no   CS/VBAC no Late prenatal care  Support Person              PNV, [facility], Labor precautions  Discussed all birth control options, considering Depo  Plans to breast feed  Annamarie Major, MD, [facility], [facility] 09/14/2020   2:45 PM

## 2020-09-20 ENCOUNTER — Inpatient Hospital Stay
Admission: EM | Admit: 2020-09-20 | Discharge: 2020-09-23 | DRG: 787 | Disposition: A | Discharge status: Home / Self Care | Payer: Medicaid Other | Attending: Obstetrics and Gynecology | Admitting: Obstetrics and Gynecology

## 2020-09-20 ENCOUNTER — Encounter: Payer: Self-pay | Admitting: Obstetrics and Gynecology

## 2020-09-20 ENCOUNTER — Telehealth: Payer: Self-pay

## 2020-09-20 ENCOUNTER — Other Ambulatory Visit: Payer: Self-pay

## 2020-09-20 DIAGNOSIS — O26893 Other specified pregnancy related conditions, third trimester: Secondary | ICD-10-CM | POA: Diagnosis present

## 2020-09-20 DIAGNOSIS — Z20822 Contact with and (suspected) exposure to covid-19: Secondary | ICD-10-CM | POA: Diagnosis present

## 2020-09-20 DIAGNOSIS — O4292 Full-term premature rupture of membranes, unspecified as to length of time between rupture and onset of labor: Secondary | ICD-10-CM | POA: Diagnosis present

## 2020-09-20 DIAGNOSIS — O9081 Anemia of the puerperium: Secondary | ICD-10-CM | POA: Diagnosis not present

## 2020-09-20 DIAGNOSIS — Z3A39 39 weeks gestation of pregnancy: Secondary | ICD-10-CM

## 2020-09-20 DIAGNOSIS — D62 Acute posthemorrhagic anemia: Secondary | ICD-10-CM | POA: Diagnosis not present

## 2020-09-20 DIAGNOSIS — O4290 Premature rupture of membranes, unspecified as to length of time between rupture and onset of labor, unspecified weeks of gestation: Secondary | ICD-10-CM | POA: Diagnosis present

## 2020-09-20 DIAGNOSIS — O42013 Preterm premature rupture of membranes, onset of labor within 24 hours of rupture, third trimester: Secondary | ICD-10-CM | POA: Diagnosis not present

## 2020-09-20 DIAGNOSIS — Z3480 Encounter for supervision of other normal pregnancy, unspecified trimester: Secondary | ICD-10-CM

## 2020-09-20 HISTORY — DX: Other specified health status: Z78.9

## 2020-09-20 LAB — TYPE AND SCREEN
ABO/RH(D): A POS
Antibody Screen: NEGATIVE

## 2020-09-20 LAB — CBC
HCT: 30.7 % — ABNORMAL LOW (ref 36.0–46.0)
Hemoglobin: 9.5 g/dL — ABNORMAL LOW (ref 12.0–15.0)
MCH: 23.2 pg — ABNORMAL LOW (ref 26.0–34.0)
MCHC: 30.9 g/dL (ref 30.0–36.0)
MCV: 75.1 fL — ABNORMAL LOW (ref 80.0–100.0)
Platelets: 264 10*3/uL (ref 150–400)
RBC: 4.09 MIL/uL (ref 3.87–5.11)
RDW: 17 % — ABNORMAL HIGH (ref 11.5–15.5)
WBC: 14.8 10*3/uL — ABNORMAL HIGH (ref 4.0–10.5)
nRBC: 0.1 % (ref 0.0–0.2)

## 2020-09-20 LAB — RESP PANEL BY RT-PCR (FLU A&B, COVID) ARPGX2
Influenza A by PCR: NEGATIVE
Influenza B by PCR: NEGATIVE
SARS Coronavirus 2 by RT PCR: NEGATIVE

## 2020-09-20 LAB — RUPTURE OF MEMBRANE (ROM)PLUS: Rom Plus: POSITIVE

## 2020-09-20 LAB — GLUCOSE, CAPILLARY: Glucose-Capillary: 72 mg/dL (ref 70–99)

## 2020-09-20 MED ORDER — OXYCODONE-ACETAMINOPHEN 5-325 MG PO TABS: 2.0000 | ORAL_TABLET | ORAL | Status: DC | PRN

## 2020-09-20 MED ORDER — AMMONIA AROMATIC IN INHA
RESPIRATORY_TRACT | Status: AC
  Filled 2020-09-20: qty 10

## 2020-09-20 MED ORDER — LACTATED RINGERS IV SOLN: 500.0000 mL | INTRAVENOUS | Status: DC | PRN

## 2020-09-20 MED ORDER — LIDOCAINE HCL (PF) 1 % IJ SOLN
INTRAMUSCULAR | Status: AC
  Filled 2020-09-20: qty 30

## 2020-09-20 MED ORDER — SOD CITRATE-CITRIC ACID 500-334 MG/5ML PO SOLN: 30.0000 mL | ORAL | Status: DC | PRN

## 2020-09-20 MED ORDER — LIDOCAINE HCL (PF) 1 % IJ SOLN: 30.0000 mL | INTRAMUSCULAR | Status: DC | PRN

## 2020-09-20 MED ORDER — OXYTOCIN BOLUS FROM INFUSION: 333.0000 mL | Freq: Once | INTRAVENOUS | Status: DC

## 2020-09-20 MED ORDER — OXYTOCIN 10 UNIT/ML IJ SOLN
INTRAMUSCULAR | Status: AC
  Filled 2020-09-20: qty 2

## 2020-09-20 MED ORDER — OXYTOCIN-SODIUM CHLORIDE 30-0.9 UT/500ML-% IV SOLN
INTRAVENOUS | Status: AC
  Filled 2020-09-20: qty 500

## 2020-09-20 MED ORDER — MISOPROSTOL 200 MCG PO TABS
ORAL_TABLET | ORAL | Status: AC
  Filled 2020-09-20: qty 4

## 2020-09-20 MED ORDER — LACTATED RINGERS IV SOLN: INTRAVENOUS | Status: DC

## 2020-09-20 MED ORDER — TERBUTALINE SULFATE 1 MG/ML IJ SOLN: 0.2500 mg | Freq: Once | INTRAMUSCULAR | Status: DC | PRN

## 2020-09-20 MED ORDER — ACETAMINOPHEN 325 MG PO TABS: 650.0000 mg | ORAL_TABLET | ORAL | Status: DC | PRN

## 2020-09-20 MED ORDER — OXYTOCIN-SODIUM CHLORIDE 30-0.9 UT/500ML-% IV SOLN
1.0000 m[IU]/min | INTRAVENOUS | Status: DC
  Administered 2020-09-20: 4 m[IU]/min via INTRAVENOUS
  Administered 2020-09-20: 2 m[IU]/min via INTRAVENOUS

## 2020-09-20 MED ORDER — ONDANSETRON HCL 4 MG/2ML IJ SOLN: 4.0000 mg | Freq: Four times a day (QID) | INTRAMUSCULAR | Status: DC | PRN

## 2020-09-20 MED ORDER — OXYCODONE-ACETAMINOPHEN 5-325 MG PO TABS: 1.0000 | ORAL_TABLET | ORAL | Status: DC | PRN

## 2020-09-20 MED ORDER — OXYTOCIN-SODIUM CHLORIDE 30-0.9 UT/500ML-% IV SOLN
2.5000 [IU]/h | INTRAVENOUS | Status: DC
  Administered 2020-09-21: 2.5 [IU]/h via INTRAVENOUS
  Filled 2020-09-20: qty 1000

## 2020-09-20 NOTE — H&P (Signed)
Audrey Cruz is a 25 y.o. female presenting with spontaneous rupture of membranes without labor at 39 weeks 3 days gestation. She reports a gush of fluid at approximately  noon today. She has not had contractions of any regularity since then, but has continued leaking small amounts of fluid. Her baby is moving well. Her pregnancy has been marked by late onset of prenatal care, an elevated one hour GTT with unsubstantiated 3 hr GTT and UTI.  She has walked and changes positions over the last several hours since her admission, and will now have her labor induced/augmented with pitocin. OB History     Gravida  2   Para      Term      Preterm      AB  1   Living         SAB  1   IAB      Ectopic      Multiple      Live Births             Past Medical History:  Diagnosis Date   Medical history non-contributory    Miscarriage    Past Surgical History:  Procedure Laterality Date   DILATION AND EVACUATION N/A 11/26/2019   Procedure: DILATATION AND EVACUATION;  Surgeon: Natale Milch, MD;  Location: [facility] ORS;  Service: Gynecology;  Laterality: N/A;   INDUCED ABORTION     Family History: family history includes Healthy in her father; Hepatitis C in her mother; Thyroid disease in her mother. Social History:  reports that she has never smoked. She has never used smokeless tobacco. She reports current drug use. Drug: Marijuana. She reports that she does not drink alcohol.     Maternal Diabetes: No, but with elevated 1hr GTT and lacing accurate 3 hr GTT results, she has been checking her glucose levels at home. Genetic Screening: Normal Maternal Ultrasounds/Referrals: Normal Fetal Ultrasounds or other Referrals:  None Maternal Substance Abuse: Yes- MJ use Significant Maternal Medications:  None Significant Maternal Lab Results:  Group B Strep negative Other Comments:  None  Review of Systems  Constitutional: Negative.   HENT: Negative.    Eyes: Negative.    Respiratory: Negative.    Cardiovascular:        Slight tachycardia  Gastrointestinal: Negative.   Endocrine: Negative.   Genitourinary:  Positive for vaginal discharge.       Leaking fluid. ROM plus is positive  Musculoskeletal: Negative.   Allergic/Immunologic: Negative.   Neurological: Negative.   Hematological: Negative.   Psychiatric/Behavioral: Negative.    History Dilation: 1.5 Effacement (%): 100 Station: -2 Exam by:: Paula Compton CNM Blood pressure 126/74, pulse (!) 110, temperature 97.8 F (36.6 C), temperature source Oral, resp. rate 15, height 5\' 6"  (1.676 m), weight 82 kg, last menstrual period 01/06/2020. Maternal Exam:  Uterine Assessment: Contraction strength is mild.  Contraction frequency is rare.  Abdomen: Patient reports no abdominal tenderness. Estimated fetal weight is 7 lbs.   Fetal presentation: vertex Introitus: Normal vulva. Normal vagina.  Ferning test: not done.  Nitrazine test: not done. Amniotic fluid character: clear. Pelvis: adequate for delivery.   Cervix: Cervix evaluated by digital exam.    Physical Exam Constitutional:      Appearance: Normal appearance.  HENT:     Head: Normocephalic and atraumatic.  Cardiovascular:     Rate and Rhythm: Tachycardia present.     Pulses: Normal pulses.     Heart sounds: Normal heart sounds.  Pulmonary:  Effort: Pulmonary effort is normal.     Breath sounds: Normal breath sounds.  Abdominal:     General: Bowel sounds are normal.     Palpations: Abdomen is soft.     Comments: Gravid abdomen. FHTS reactive, with moderate variability, accels present, decels absent, Cat 1  Genitourinary:    General: Normal vulva.  Musculoskeletal:        General: Normal range of motion.     Cervical back: Normal range of motion and neck supple.  Skin:    General: Skin is warm and dry.  Neurological:     General: No focal deficit present.     Mental Status: She is alert and oriented to person, place, and time.   Psychiatric:        Mood and Affect: Mood normal.        Behavior: Behavior normal.    Prenatal labs: ABO, Rh: --/--/A POS (09/19 1645) Antibody: NEG (09/19 1645) Rubella: 1.71 (07/18 1022) RPR: Non Reactive (07/18 1022)  HBsAg: Negative (07/18 1022)  HIV: Non Reactive (07/18 1022)  GBS: Negative/-- (09/06 1456)   Assessment/Plan: IUP 39 weeks 3 days gestation  SROM - clear fluid, irregular mild contractions Category 1 FHTS Bishop score 7  Plan:  Induction with pitocin, slow dose rate. Continuous EFM Routine admission orders, IV access Patient plans epidural. Will likely use one dose of IV medication PRN.   Mirna Mires 09/20/2020, 9:52 PM

## 2020-09-20 NOTE — Progress Notes (Signed)
Audrey Cruz is a 25 y.o. G2P0010 at [redacted]w[redacted]d by ultrasound admitted for rupture of membranes  Subjective:  she is just now feeling some mild cramping. Continues leaking small amounts of fluid.   Objective: BP 126/74 (BP Location: Left Leg)   Pulse (!) 110   Temp 97.8 F (36.6 C) (Oral)   Resp 15   Ht 5\' 6"  (1.676 m)   Wt 82 kg   LMP 01/06/2020 (Approximate)   BMI 29.18 kg/m  I/O last 3 completed shifts: In: 158.3 [P.O.:120; I.V.:38.3] Out: -  No intake/output data recorded.  FHT:  FHR: 120s bpm, variability: moderate,  accelerations:  Present,  decelerations:  Absent UC:   irregular SVE:   Dilation: 1.5 Effacement (%): 100 Station: -2 Exam by:: [email or web address] CNM  Labs: Lab Results  Component Value Date   WBC 14.8 (H) 09/20/2020   HGB 9.5 (L) 09/20/2020   HCT 30.7 (L) 09/20/2020   MCV 75.1 (L) 09/20/2020   PLT 264 09/20/2020    Assessment / Plan: Induction of labor due to PROM,  progressing well on pitocin  Labor:  will continue to advance pitocin  Fetal Wellbeing:  Category I Pain Control:  Labor support without medications I/D:  n/a Anticipated MOD:  NSVD Will recheck if she needs pain medication and prior to epidural placement.  09/22/2020 09/20/2020, 10:04 PM

## 2020-09-20 NOTE — Telephone Encounter (Signed)
Pt tx'd from from front desk c/o thinks water has broken.  Pt is [redacted]w[redacted]d; not able to stay dry. No ctxs yet; lower back is hurting.  Adv to go to L&D via ED.  Mindy notified.

## 2020-09-20 NOTE — OB Triage Note (Addendum)
Pt presented to L/D triage with reported clear LOF that began at 1200 today. Pt reports mild ongoing back pain, rated 1/10. Pt reports no bleeding and positive fetal movement.  Monitors applied and assessing- Initial BP 127/87. Recent intercourse last night. ROM+ send for eval.

## 2020-09-21 ENCOUNTER — Encounter: Payer: Self-pay | Admitting: Obstetrics and Gynecology

## 2020-09-21 ENCOUNTER — Encounter
Admission: EM | Disposition: A | Discharge status: Home / Self Care | Payer: Self-pay | Source: Home / Self Care | Attending: Obstetrics and Gynecology

## 2020-09-21 ENCOUNTER — Encounter: Payer: Medicaid Other | Admitting: Obstetrics and Gynecology

## 2020-09-21 ENCOUNTER — Inpatient Hospital Stay: Payer: Medicaid Other | Admitting: Anesthesiology

## 2020-09-21 DIAGNOSIS — Z3A39 39 weeks gestation of pregnancy: Secondary | ICD-10-CM

## 2020-09-21 DIAGNOSIS — O42013 Preterm premature rupture of membranes, onset of labor within 24 hours of rupture, third trimester: Secondary | ICD-10-CM | POA: Diagnosis not present

## 2020-09-21 LAB — URINE DRUG SCREEN, QUALITATIVE (ARMC ONLY)
Amphetamines, Ur Screen: NOT DETECTED
Barbiturates, Ur Screen: NOT DETECTED
Benzodiazepine, Ur Scrn: NOT DETECTED
Cannabinoid 50 Ng, Ur ~~LOC~~: NOT DETECTED
Cocaine Metabolite,Ur ~~LOC~~: NOT DETECTED
MDMA (Ecstasy)Ur Screen: NOT DETECTED
Methadone Scn, Ur: NOT DETECTED
Opiate, Ur Screen: NOT DETECTED
Phencyclidine (PCP) Ur S: NOT DETECTED
Tricyclic, Ur Screen: NOT DETECTED

## 2020-09-21 LAB — RPR: RPR Ser Ql: NONREACTIVE

## 2020-09-21 SURGERY — Surgical Case
Anesthesia: Epidural

## 2020-09-21 MED ORDER — OXYTOCIN-SODIUM CHLORIDE 30-0.9 UT/500ML-% IV SOLN
2.5000 [IU]/h | INTRAVENOUS | Status: AC
  Administered 2020-09-21: 2.5 [IU]/h via INTRAVENOUS

## 2020-09-21 MED ORDER — SOD CITRATE-CITRIC ACID 500-334 MG/5ML PO SOLN
ORAL | Status: AC
  Administered 2020-09-21: 30 mL
  Filled 2020-09-21: qty 15

## 2020-09-21 MED ORDER — BUPIVACAINE HCL (PF) 0.5 % IJ SOLN
INTRAMUSCULAR | Status: AC
  Filled 2020-09-21: qty 30

## 2020-09-21 MED ORDER — SIMETHICONE 80 MG PO CHEW: 80.0000 mg | CHEWABLE_TABLET | ORAL | Status: DC | PRN

## 2020-09-21 MED ORDER — BUPIVACAINE IN DEXTROSE 0.75-8.25 % IT SOLN
INTRATHECAL | Status: DC | PRN
  Administered 2020-09-21: 1.4 mL via INTRATHECAL

## 2020-09-21 MED ORDER — LACTATED RINGERS IV SOLN: INTRAVENOUS | Status: DC

## 2020-09-21 MED ORDER — LIDOCAINE HCL (PF) 1 % IJ SOLN
INTRAMUSCULAR | Status: DC | PRN
  Administered 2020-09-21: 1 mL

## 2020-09-21 MED ORDER — SODIUM CHLORIDE 0.9% FLUSH: 3.0000 mL | INTRAVENOUS | Status: DC | PRN

## 2020-09-21 MED ORDER — PRENATAL MULTIVITAMIN CH
1.0000 | ORAL_TABLET | Freq: Every day | ORAL | Status: DC
  Administered 2020-09-22 – 2020-09-23 (×2): 1 via ORAL
  Filled 2020-09-21 (×2): qty 1

## 2020-09-21 MED ORDER — OXYTOCIN-SODIUM CHLORIDE 30-0.9 UT/500ML-% IV SOLN
INTRAVENOUS | Status: DC | PRN
  Administered 2020-09-21: 250 mL/h via INTRAVENOUS
  Administered 2020-09-21: 300 mL via INTRAVENOUS

## 2020-09-21 MED ORDER — EPHEDRINE 5 MG/ML INJ: 10.0000 mg | INTRAVENOUS | Status: DC | PRN

## 2020-09-21 MED ORDER — SOD CITRATE-CITRIC ACID 500-334 MG/5ML PO SOLN: 30.0000 mL | ORAL | Status: DC

## 2020-09-21 MED ORDER — DIPHENHYDRAMINE HCL 50 MG/ML IJ SOLN: 12.5000 mg | INTRAMUSCULAR | Status: DC | PRN

## 2020-09-21 MED ORDER — OXYCODONE-ACETAMINOPHEN 5-325 MG PO TABS
1.0000 | ORAL_TABLET | ORAL | Status: DC | PRN
  Administered 2020-09-22 – 2020-09-23 (×2): 1 via ORAL
  Filled 2020-09-21 (×2): qty 1

## 2020-09-21 MED ORDER — CEFAZOLIN SODIUM-DEXTROSE 2-4 GM/100ML-% IV SOLN
2.0000 g | INTRAVENOUS | Status: AC
  Administered 2020-09-21: 2 g via INTRAVENOUS

## 2020-09-21 MED ORDER — BUTORPHANOL TARTRATE 1 MG/ML IJ SOLN
1.0000 mg | INTRAMUSCULAR | Status: DC | PRN
Start: 2020-09-21 — End: 2020-09-21
  Administered 2020-09-21: 1 mg via INTRAVENOUS
  Filled 2020-09-21: qty 1

## 2020-09-21 MED ORDER — FENTANYL CITRATE (PF) 100 MCG/2ML IJ SOLN
INTRAMUSCULAR | Status: DC | PRN
  Administered 2020-09-21: 85 ug via INTRAVENOUS

## 2020-09-21 MED ORDER — NALBUPHINE HCL 10 MG/ML IJ SOLN: 5.0000 mg | INTRAMUSCULAR | Status: DC | PRN

## 2020-09-21 MED ORDER — FENTANYL CITRATE (PF) 100 MCG/2ML IJ SOLN
INTRAMUSCULAR | Status: AC
  Filled 2020-09-21: qty 2

## 2020-09-21 MED ORDER — FENTANYL CITRATE (PF) 100 MCG/2ML IJ SOLN: INTRAMUSCULAR | Status: DC | PRN

## 2020-09-21 MED ORDER — BUPIVACAINE HCL (PF) 0.5 % IJ SOLN: 20.0000 mL | INTRAMUSCULAR | Status: DC

## 2020-09-21 MED ORDER — BUPIVACAINE HCL (PF) 0.25 % IJ SOLN
INTRAMUSCULAR | Status: DC | PRN
  Administered 2020-09-21: 4 mL via EPIDURAL

## 2020-09-21 MED ORDER — ONDANSETRON HCL 4 MG/2ML IJ SOLN
INTRAMUSCULAR | Status: DC | PRN
  Administered 2020-09-21 (×2): 4 mg via INTRAVENOUS

## 2020-09-21 MED ORDER — WITCH HAZEL-GLYCERIN EX PADS: 1.0000 "application " | MEDICATED_PAD | CUTANEOUS | Status: DC | PRN

## 2020-09-21 MED ORDER — LIDOCAINE-EPINEPHRINE (PF) 1.5 %-1:200000 IJ SOLN
INTRAMUSCULAR | Status: DC | PRN
Start: 2020-09-21 — End: 2020-09-21
  Administered 2020-09-21: 3 mL via EPIDURAL

## 2020-09-21 MED ORDER — DIBUCAINE (PERIANAL) 1 % EX OINT: 1.0000 "application " | TOPICAL_OINTMENT | CUTANEOUS | Status: DC | PRN

## 2020-09-21 MED ORDER — FENTANYL-BUPIVACAINE-NACL 0.5-0.125-0.9 MG/250ML-% EP SOLN
EPIDURAL | Status: AC
  Filled 2020-09-21: qty 250

## 2020-09-21 MED ORDER — COCONUT OIL OIL
1.0000 "application " | TOPICAL_OIL | Status: DC | PRN
  Filled 2020-09-21: qty 120

## 2020-09-21 MED ORDER — 0.9 % SODIUM CHLORIDE (POUR BTL) OPTIME
TOPICAL | Status: DC | PRN
  Administered 2020-09-21: 1000 mL

## 2020-09-21 MED ORDER — CEFAZOLIN SODIUM-DEXTROSE 2-4 GM/100ML-% IV SOLN
INTRAVENOUS | Status: AC
  Filled 2020-09-21: qty 100

## 2020-09-21 MED ORDER — DIPHENHYDRAMINE HCL 25 MG PO CAPS: 25.0000 mg | ORAL_CAPSULE | ORAL | Status: DC | PRN

## 2020-09-21 MED ORDER — IBUPROFEN 600 MG PO TABS
600.0000 mg | ORAL_TABLET | Freq: Four times a day (QID) | ORAL | Status: DC
  Administered 2020-09-22 – 2020-09-23 (×3): 600 mg via ORAL
  Filled 2020-09-21 (×3): qty 1

## 2020-09-21 MED ORDER — TETANUS-DIPHTH-ACELL PERTUSSIS 5-2.5-18.5 LF-MCG/0.5 IM SUSY: 0.5000 mL | PREFILLED_SYRINGE | Freq: Once | INTRAMUSCULAR | Status: DC

## 2020-09-21 MED ORDER — SIMETHICONE 80 MG PO CHEW
80.0000 mg | CHEWABLE_TABLET | Freq: Three times a day (TID) | ORAL | Status: DC
  Administered 2020-09-22 – 2020-09-23 (×5): 80 mg via ORAL
  Filled 2020-09-21 (×5): qty 1

## 2020-09-21 MED ORDER — LACTATED RINGERS IV SOLN
500.0000 mL | Freq: Once | INTRAVENOUS | Status: AC
  Administered 2020-09-21: 500 mL via INTRAVENOUS

## 2020-09-21 MED ORDER — LACTATED RINGERS IV BOLUS
500.0000 mL | Freq: Once | INTRAVENOUS | Status: AC
  Administered 2020-09-21: 500 mL via INTRAVENOUS

## 2020-09-21 MED ORDER — MENTHOL 3 MG MT LOZG
1.0000 | LOZENGE | OROMUCOSAL | Status: DC | PRN
  Filled 2020-09-21: qty 9

## 2020-09-21 MED ORDER — FENTANYL-BUPIVACAINE-NACL 0.5-0.125-0.9 MG/250ML-% EP SOLN
EPIDURAL | Status: DC | PRN
  Administered 2020-09-21: 12 mL/h via EPIDURAL

## 2020-09-21 MED ORDER — FENTANYL CITRATE (PF) 100 MCG/2ML IJ SOLN
INTRAMUSCULAR | Status: DC | PRN
  Administered 2020-09-21: 15 ug via INTRATHECAL

## 2020-09-21 MED ORDER — ONDANSETRON HCL 4 MG/2ML IJ SOLN
4.0000 mg | Freq: Three times a day (TID) | INTRAMUSCULAR | Status: DC | PRN
  Administered 2020-09-21: 4 mg via INTRAVENOUS
  Filled 2020-09-21: qty 2

## 2020-09-21 MED ORDER — SENNOSIDES-DOCUSATE SODIUM 8.6-50 MG PO TABS
2.0000 | ORAL_TABLET | ORAL | Status: DC
  Administered 2020-09-21 – 2020-09-22 (×2): 2 via ORAL
  Filled 2020-09-21 (×2): qty 2

## 2020-09-21 MED ORDER — KETOROLAC TROMETHAMINE 30 MG/ML IJ SOLN: 30.0000 mg | Freq: Four times a day (QID) | INTRAMUSCULAR | Status: AC | PRN

## 2020-09-21 MED ORDER — PHENYLEPHRINE 40 MCG/ML (10ML) SYRINGE FOR IV PUSH (FOR BLOOD PRESSURE SUPPORT): 80.0000 ug | PREFILLED_SYRINGE | INTRAVENOUS | Status: DC | PRN

## 2020-09-21 MED ORDER — NALBUPHINE HCL 10 MG/ML IJ SOLN: 5.0000 mg | Freq: Once | INTRAMUSCULAR | Status: DC | PRN

## 2020-09-21 MED ORDER — FENTANYL-BUPIVACAINE-NACL 0.5-0.125-0.9 MG/250ML-% EP SOLN: 12.0000 mL/h | EPIDURAL | Status: DC | PRN

## 2020-09-21 MED ORDER — SODIUM CHLORIDE 0.9 % IV SOLN
500.0000 mg | INTRAVENOUS | Status: DC
  Filled 2020-09-21: qty 500

## 2020-09-21 MED ORDER — ACETAMINOPHEN 500 MG PO TABS
1000.0000 mg | ORAL_TABLET | Freq: Four times a day (QID) | ORAL | Status: AC
  Administered 2020-09-22 (×3): 1000 mg via ORAL
  Filled 2020-09-21 (×3): qty 2

## 2020-09-21 MED ORDER — SODIUM CHLORIDE 0.9 % IV SOLN
INTRAVENOUS | Status: DC | PRN
  Administered 2020-09-21 (×3): 5 mL via EPIDURAL

## 2020-09-21 MED ORDER — MORPHINE SULFATE (PF) 0.5 MG/ML IJ SOLN
INTRAMUSCULAR | Status: DC | PRN
  Administered 2020-09-21: 100 ug via INTRATHECAL

## 2020-09-21 MED ORDER — NALOXONE HCL 0.4 MG/ML IJ SOLN: 0.4000 mg | INTRAMUSCULAR | Status: DC | PRN

## 2020-09-21 MED ORDER — SODIUM CHLORIDE 0.9 % IV SOLN
INTRAVENOUS | Status: DC | PRN
  Administered 2020-09-21: 40 ug/min via INTRAVENOUS

## 2020-09-21 MED ORDER — BUPIVACAINE 0.25 % ON-Q PUMP DUAL CATH 400 ML
400.0000 mL | INJECTION | Status: DC
  Filled 2020-09-21: qty 400

## 2020-09-21 MED ORDER — DIPHENHYDRAMINE HCL 25 MG PO CAPS: 25.0000 mg | ORAL_CAPSULE | Freq: Four times a day (QID) | ORAL | Status: DC | PRN

## 2020-09-21 MED ORDER — BUPIVACAINE HCL (PF) 0.5 % IJ SOLN
INTRAMUSCULAR | Status: DC | PRN
  Administered 2020-09-21: 30 mL

## 2020-09-21 MED ORDER — MORPHINE SULFATE (PF) 0.5 MG/ML IJ SOLN
INTRAMUSCULAR | Status: AC
  Filled 2020-09-21: qty 10

## 2020-09-21 MED ORDER — KETOROLAC TROMETHAMINE 30 MG/ML IJ SOLN
30.0000 mg | Freq: Four times a day (QID) | INTRAMUSCULAR | Status: AC
  Administered 2020-09-21 – 2020-09-22 (×4): 30 mg via INTRAVENOUS
  Filled 2020-09-21 (×4): qty 1

## 2020-09-21 MED ORDER — NALOXONE HCL 4 MG/10ML IJ SOLN
1.0000 ug/kg/h | INTRAVENOUS | Status: DC | PRN
  Filled 2020-09-21: qty 5

## 2020-09-21 MED ORDER — MEPERIDINE HCL 25 MG/ML IJ SOLN: 6.2500 mg | INTRAMUSCULAR | Status: DC | PRN

## 2020-09-21 SURGICAL SUPPLY — 33 items
BAG COUNTER SPONGE SURGICOUNT (BAG) ×2 IMPLANT
CATH KIT ON-Q SILVERSOAK 5IN (CATHETERS) ×4 IMPLANT
CHLORAPREP W/TINT 26 (MISCELLANEOUS) ×4 IMPLANT
DERMABOND ADHESIVE PROPEN (GAUZE/BANDAGES/DRESSINGS) ×2
DERMABOND ADVANCED (GAUZE/BANDAGES/DRESSINGS) ×1
DERMABOND ADVANCED .7 DNX12 (GAUZE/BANDAGES/DRESSINGS) ×1 IMPLANT
DERMABOND ADVANCED .7 DNX6 (GAUZE/BANDAGES/DRESSINGS) ×2 IMPLANT
DRSG OPSITE POSTOP 4X10 (GAUZE/BANDAGES/DRESSINGS) ×2 IMPLANT
DRSG TELFA 3X8 NADH (GAUZE/BANDAGES/DRESSINGS) ×2 IMPLANT
ELECT CAUTERY BLADE 6.4 (BLADE) ×2 IMPLANT
ELECT REM PT RETURN 9FT ADLT (ELECTROSURGICAL) ×2
ELECTRODE REM PT RTRN 9FT ADLT (ELECTROSURGICAL) ×1 IMPLANT
GAUZE SPONGE 4X4 12PLY STRL (GAUZE/BANDAGES/DRESSINGS) ×2 IMPLANT
GLOVE SURG ENC MOIS LTX SZ7 (GLOVE) ×2 IMPLANT
GLOVE SURG UNDER LTX SZ7.5 (GLOVE) ×2 IMPLANT
GOWN STRL REUS W/ TWL LRG LVL3 (GOWN DISPOSABLE) ×3 IMPLANT
GOWN STRL REUS W/TWL LRG LVL3 (GOWN DISPOSABLE) ×3
MANIFOLD NEPTUNE II (INSTRUMENTS) ×2 IMPLANT
MAT PREVALON FULL STRYKER (MISCELLANEOUS) ×2 IMPLANT
NS IRRIG 1000ML POUR BTL (IV SOLUTION) ×2 IMPLANT
PACK C SECTION AR (MISCELLANEOUS) ×2 IMPLANT
PAD OB MATERNITY 4.3X12.25 (PERSONAL CARE ITEMS) ×2 IMPLANT
PAD PREP 24X41 OB/GYN DISP (PERSONAL CARE ITEMS) ×2 IMPLANT
PENCIL SMOKE EVACUATOR (MISCELLANEOUS) ×2 IMPLANT
SCRUB EXIDINE 4% CHG 4OZ (MISCELLANEOUS) ×2 IMPLANT
STRIP CLOSURE SKIN 1/2X4 (GAUZE/BANDAGES/DRESSINGS) ×2 IMPLANT
STRIP CLOSURE SKIN 1X5 (GAUZE/BANDAGES/DRESSINGS) ×2 IMPLANT
SUT MNCRL AB 4-0 PS2 18 (SUTURE) ×2 IMPLANT
SUT PDS AB 1 TP1 96 (SUTURE) ×4 IMPLANT
SUT VIC AB 0 CTX 36 (SUTURE) ×2
SUT VIC AB 0 CTX36XBRD ANBCTRL (SUTURE) ×2 IMPLANT
SUT VIC AB 2-0 CT1 36 (SUTURE) ×2 IMPLANT
WATER STERILE IRR 500ML POUR (IV SOLUTION) ×2 IMPLANT

## 2020-09-21 NOTE — Op Note (Signed)
Preoperative Diagnosis: 1) 25 y.o. G2P1011 at [redacted]w[redacted]d active phase arrest   Postoperative Diagnosis: 1) 25 y.o. G2P1011 at [redacted]w[redacted]d active phase arrest  Operation Performed: Primary low transverse C-section via pfannenstiel skin incision  Indication: Patient presented with SROM, pitocin augmentation, progressed to 10cm with no descent in station after 2-hrs of pushing with good maternal effort  Anesthesia: Spinal  Primary Surgeon: Vena Austria, MD  Preoperative Antibiotics: 2g Ancef and 500mg  of Azithromycin  Estimated Blood Loss: 185 mL  IV Fluids: 1L  Urine Output:: [id number]  Drains or Tubes: Foley to gravity drainage, ON-Q catheter system  Implants: none  Specimens Removed: none  Complications: none  Intraoperative Findings:  Normal tubes ovaries and uterus.  Delivery resulted in the birth of a liveborn female, APGAR (1 MIN):   APGAR (5 MINS): 9 APGAR (10 MINS): 9, weight 8lbs 8oz  Patient Condition: stable  Procedure in Detail:  Patient was taken to the operating room were she was administered regional anesthesia.  She was positioned in the supine position, prepped and draped in the  Usual sterile fashion.  Prior to proceeding with the case a time out was performed and the level of anesthetic was checked and noted to be adequate.  Utilizing the scalpel a pfannenstiel skin incision was made 2cm above the pubic symphysis and carried down sharply to the the level of the rectus fascia.  The fascia was incised in the midline using the scalpel and then extended using mayo scissors.  The superior border of the rectus fascia was grasped with two Kocher clamps and the underlying rectus muscles were dissected of the fascia using blunt dissection.  The median raphae was incised using Mayo scissors.   The inferior border of the rectus fascia was dissected of the rectus muscles in a similar fashion.  The midline was identified, the peritoneum was entered bluntly and expanded using manual  tractions.  The uterus was noted to be in a none rotated position.  Next the bladder blade was placed retracting the bladder caudally.  A bladder flap was created.  The bladder reflection was grasped with a pickup, and Metzenbaum scissors were then used the undermine the bladder reflection.  The bladder flap was developed using digital dissection.  The bladder blade was replaced retracting the bladder caudally out of the operative field. A low transverse incision was scored on the lower uterine segment.  The hysterotomy was entered bluntly using the operators finger.  The hysterotomy incision was extended using manual traction.  The operators hand was placed within the hysterotomy position noting the fetus to be within the OA position.  The vertex was grasped, flexed, brought to the incision, and delivered a traumatically using fundal pressure.  The remainder of the body delivered with ease.  The infant was suctioned, cord was clamped and cut before handing off to the awaiting neonatologist.  The placenta was delivered using manual extraction.  The uterus was exteriorized, wiped clean of clots and debris using two moist laps.  The hysterotomy was closed using a two layer closure of 0 Vicryl, with the first being a running locked, the second a vertical imbricating.  The uterus was returned to the abdomen.  The peritoneal gutters were wiped clean of clots and debris using two moist laps.  The hysterotomy incision was re-inspected noted to be hemostatic.  The rectus muscles were inspected noted to be hemostatic.  The superior border of the rectus fascia was grasped with a Kocher clamp.  The ON-Q trocars were  then placed 4cm above the superior border of the incision and tunneled subfascially.  The introducers were removed and the catheters were threaded through the sleeves after which the sleeves were removed.  The fascia was closed using a looped #1 PDS in a running fashion taking 1cm by 1cm bites.  The subcutaneous  tissue was irrigated using warm saline, hemostasis achieved using the bovie.  The subcutaneous dead space was less than 3cm and was not closed.  The skin was closed using Insorb staples.  Sponge needle and instrument counts were corrects times two.  The patient tolerated the procedure well and was taken to the recovery room in stable condition.

## 2020-09-21 NOTE — Anesthesia Procedure Notes (Addendum)
Epidural Patient location during procedure: OB Start time: 09/21/2020 3:00 AM End time: 09/21/2020 3:26 AM  Staffing Anesthesiologist: Foye Deer, MD Performed: anesthesiologist   Preanesthetic Checklist Completed: patient identified, IV checked, site marked, risks and benefits discussed, surgical consent, monitors and equipment checked, pre-op evaluation and timeout performed  Epidural Patient position: sitting Prep: ChloraPrep Patient monitoring: heart rate, continuous pulse ox and blood pressure Approach: midline Location: L3-L4 Injection technique: LOR saline  Needle:  Needle type: Tuohy  Needle gauge: 18 G Needle length: 9 cm Needle insertion depth: 6 cm Catheter type: closed end Catheter size: 20 Guage Catheter at skin depth: 10 cm Test dose: negative and 1.5% lidocaine with Epi 1:200 K  Assessment Events: blood not aspirated, injection not painful, no injection resistance and no paresthesia  Additional Notes Vitals recording through Labor and Delivery recordsReason for block:procedure for pain

## 2020-09-21 NOTE — Transfer of Care (Signed)
Immediate Anesthesia Transfer of Care Note  Patient: Audrey Cruz  Procedure(s) Performed: CESAREAN SECTION  Patient Location: PACU and Mother/Baby  Anesthesia Type:Spinal  Level of Consciousness: awake  Airway & Oxygen Therapy: Patient Spontanous Breathing  Post-op Assessment: Report given to RN  Post vital signs: stable  Last Vitals:  Vitals Value Taken Time  BP    Temp    Pulse    Resp    SpO2      Last Pain:  Vitals:   09/21/20 1355  TempSrc: Oral  PainSc:       Patients Stated Pain Goal: 0 (09/21/20 0930)  Complications: No notable events documented.

## 2020-09-21 NOTE — Anesthesia Procedure Notes (Addendum)
Spinal  Patient location during procedure: OB Start time: 09/21/2020 4:09 PM Staffing Performed: resident/CRNA  Preanesthetic Checklist Completed: patient identified, IV checked, site marked, risks and benefits discussed, surgical consent, monitors and equipment checked, pre-op evaluation and timeout performed Spinal Block Patient position: sitting Prep: ChloraPrep Patient monitoring: heart rate, continuous pulse ox and blood pressure Approach: midline Location: L3-4 Injection technique: single-shot Needle Needle type: Pencan  Needle gauge: 24 G Needle length: 10 cm Assessment Sensory level: T4 Additional Notes Negative heme, negative pain with injection, negative paresthesia.  Good free flow csf pre/post.  Pt tolerated well. Epidural dc'd with tip intact prior.

## 2020-09-21 NOTE — Progress Notes (Signed)
Audrey Cruz is a 25 y.o. G2P0010 at [redacted]w[redacted]d by ultrasound admitted for induction of labor due to Spontaneous rupture of BOW., rupture of membranes  Subjective:   Objective: BP 132/71 (BP Location: Left Arm)   Pulse 73   Temp 97.7 F (36.5 C) (Oral)   Resp (!) 22   Ht 5\' 6"  (1.676 m)   Wt 82 kg   LMP 01/06/2020 (Approximate)   SpO2 100%   BMI 29.18 kg/m  I/O last 3 completed shifts: In: 158.3 [P.O.:120; I.V.:38.3] Out: -  No intake/output data recorded.  FHT:  FHR: 115 baseline bpm, variability: moderate,  accelerations:  Present,  decelerations:  Present occasional variables and  early decels.03/05/2020 UC:   regular, every 2-3 minutes Pitocin infusing at 10 mu/min SVE:   6-7 cms/80%/-2. With thicker anterior lip noted. + molding of fetal head Likely OP.  Labs: Lab Results  Component Value Date   WBC 14.8 (H) 09/20/2020   HGB 9.5 (L) 09/20/2020   HCT 30.7 (L) 09/20/2020   MCV 75.1 (L) 09/20/2020   PLT 264 09/20/2020    Assessment / Plan: Induction of labor due to PROM,  progressing well on pitocin  Labor: Progressing normally  Fetal Wellbeing:  Category II Pain Control:  Epidural I/D:  n/a Anticipated MOD:  NSVD Pericare performed. Patient positioned on her right side with peanut ball to facilitate internal rotation.  09/22/2020 09/21/2020, 6:46 AM

## 2020-09-21 NOTE — Anesthesia Preprocedure Evaluation (Addendum)
Anesthesia Evaluation  Patient identified by MRN, date of birth, ID band Patient awake    Reviewed: Allergy & Precautions, NPO status , Patient's Chart, lab work & pertinent test results  History of Anesthesia Complications Negative for: history of anesthetic complications  Airway Mallampati: II  TM Distance: >3 FB Neck ROM: Full    Dental no notable dental hx. (+) Teeth Intact   Pulmonary neg pulmonary ROS, neg sleep apnea, neg COPD, Patient abstained from smoking.Not current smoker,    Pulmonary exam normal breath sounds clear to auscultation       Cardiovascular Exercise Tolerance: Good METS(-) hypertension(-) CAD and (-) Past MI negative cardio ROS  (-) dysrhythmias  Rhythm:Regular Rate:Normal - Systolic murmurs    Neuro/Psych negative neurological ROS  negative psych ROS   GI/Hepatic negative GI ROS, Neg liver ROS, neg GERD  ,(+)     (-) substance abuse  ,   Endo/Other  negative endocrine ROSneg diabetes  Renal/GU negative Renal ROS     Musculoskeletal negative musculoskeletal ROS (+)   Abdominal   Peds  Hematology  (+) anemia , Patient mildly symptomatic from the anemia - some dyspnea and some light headedness, but much better than prior   Anesthesia Other Findings Gravid  Hx of marijuana   Reproductive/Obstetrics Retained products of conception                            Anesthesia Physical  Anesthesia Plan  ASA: II and emergent  Anesthesia Plan: Epidural, Spinal and General   Post-op Pain Management:    Induction:   PONV Risk Score and Plan: 4 or greater  Airway Management Planned: Natural Airway  Additional Equipment:   Intra-op Plan:   Post-operative Plan:   Informed Consent: I have reviewed the patients History and Physical, chart, labs and discussed the procedure including the risks, benefits and alternatives for the proposed anesthesia with the patient  or authorized representative who has indicated his/her understanding and acceptance.       Plan Discussed with:   Anesthesia Plan Comments: (Patient reports no bleeding problems and no anticoagulant use.   Patient consented for risks of anesthesia including but not limited to:  - adverse reactions to medications - risk of bleeding, infection and or nerve damage from epidural that could lead to paralysis - risk of headache or failed epidural  Patient voiced understanding.  Addendum 09/21/20 at 1535 - C/S for FTP.  No level with epidural, 7/10 pain.  Plan to remove epidural and place spinal.  Risks discussed, including need for GETA)     Anesthesia Quick Evaluation

## 2020-09-21 NOTE — Progress Notes (Signed)
Subjective:  Patient feeling a lot of pressure, has been pushing for 2-hrs without progress  Objective:   Vitals: Blood pressure 120/68, pulse 82, temperature 98.3 F (36.8 C), temperature source Oral, resp. rate 16, height 5\' 6"  (1.676 m), weight 82 kg, last menstrual period 01/06/2020, SpO2 99 %. General: NAD Abdomen: gravid, non-tender Cervical Exam:  Dilation: 10 Dilation Complete Date: 09/21/20 Dilation Complete Time: 1320 Effacement (%): 100 Cervical Position: Middle Station: Plus 1, Plus 2 Presentation: Vertex Exam by:: [email or web address], RN  FHT: 125, moderate variability, +accels, early deceleration Toco: q66min  Results for orders placed or performed during the hospital encounter of 09/20/20 (from the past 24 hour(s))  CBC     Status: Abnormal   Collection Time: 09/20/20  4:45 PM  Result Value Ref Range   WBC 14.8 (H) 4.0 - 10.5 K/uL   RBC 4.09 3.87 - 5.11 MIL/uL   Hemoglobin 9.5 (L) 12.0 - 15.0 g/dL   HCT 09/22/20 (L) 26.4 - 15.8 %   MCV 75.1 (L) 80.0 - 100.0 fL   MCH 23.2 (L) 26.0 - 34.0 pg   MCHC 30.9 30.0 - 36.0 g/dL   RDW 30.9 (H) 40.7 - 68.0 %   Platelets 264 150 - 400 K/uL   nRBC 0.1 0.0 - 0.2 %  Type and screen [facility]     Status: None   Collection Time: 09/20/20  4:45 PM  Result Value Ref Range   ABO/RH(D) A POS    Antibody Screen NEG    Sample Expiration      09/23/2020,2359 Performed at [facility], [address] [place], [place]   RPR     Status: None   Collection Time: 09/20/20  4:45 PM  Result Value Ref Range   RPR Ser Ql NON REACTIVE NON REACTIVE  Glucose, capillary     Status: None   Collection Time: 09/20/20  5:22 PM  Result Value Ref Range   Glucose-Capillary 72 70 - 99 mg/dL  Urine Drug Screen, Qualitative ([facility] only)     Status: None   Collection Time: 09/21/20  1:19 AM  Result Value Ref Range   Tricyclic, Ur Screen NONE DETECTED NONE DETECTED   Amphetamines, Ur Screen NONE DETECTED NONE  DETECTED   MDMA (Ecstasy)Ur Screen NONE DETECTED NONE DETECTED   Cocaine Metabolite,Ur [place] NONE DETECTED NONE DETECTED   Opiate, Ur Screen NONE DETECTED NONE DETECTED   Phencyclidine (PCP) Ur S NONE DETECTED NONE DETECTED   Cannabinoid 50 Ng, Ur [place] NONE DETECTED NONE DETECTED   Barbiturates, Ur Screen NONE DETECTED NONE DETECTED   Benzodiazepine, Ur Scrn NONE DETECTED NONE DETECTED   Methadone Scn, Ur NONE DETECTED NONE DETECTED    Assessment:   25 y.o. G2P0010 [redacted]w[redacted]d term labor with second stage arrest  Plan:   1) Labor  - patient has been pushing for 2-hrs without any appreciable progress despite good maternal effort.  Fetus has been tolerating pushing well.  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  The patient was counseled regarding risk and benefits to proceeding with Cesarean section to expedite delivery.  Risk of cesarean section were discussed including risk of bleeding and need for potential intraoperative or postoperative blood transfusion with a rate of approximately 5% quoted for all Cesarean sections, risk of injury to adjacent organs including but not limited to bowl and bladder, the need for additional surgical procedures to address such injuries, and the risk  of infection.  The risk of continued attempts at vaginal delivery include but are note limited to worsening fetal or maternal status.  After consideration of options the patient is amenable to proceed with primary cesarean section for delivery.   2) Fetus - cat I  Vena Austria, MD, [facility], [facility] 09/21/2020, 3:20 PM

## 2020-09-21 NOTE — Anesthesia Postprocedure Evaluation (Signed)
Anesthesia Post Note  Patient: Audrey Cruz  Procedure(s) Performed: CESAREAN SECTION  Patient location during evaluation: PACU Anesthesia Type: Epidural Level of consciousness: oriented and awake and alert Pain management: pain level controlled Vital Signs Assessment: post-procedure vital signs reviewed and stable Respiratory status: spontaneous breathing, respiratory function stable and patient connected to nasal cannula oxygen Cardiovascular status: blood pressure returned to baseline and stable Postop Assessment: no headache, no backache and no apparent nausea or vomiting Anesthetic complications: no   No notable events documented.   Last Vitals:  Vitals:   09/21/20 1851 09/21/20 1852  BP:    Pulse: 68 74  Resp: 14 19  Temp:    SpO2: 98% 98%   BP: 112/76 at 1845  Last Pain:  Vitals:   09/21/20 1355  TempSrc: Oral  PainSc:                  Neldon Mc

## 2020-09-21 NOTE — Progress Notes (Signed)
  Labor Progress Note   25 y.o. G2P0010 @ [redacted]w[redacted]d , admitted for  Pregnancy, Labor Management.   Subjective:  Generally comfortable with epidural, expressing some fears related to pushing. Reassurance given.  Objective:  BP 120/68 (BP Location: Left Arm)   Pulse 82   Temp 98.1 F (36.7 C) (Oral)   Resp 16   Ht 5\' 6"  (1.676 m)   Wt 82 kg   LMP 01/06/2020 (Approximate)   SpO2 99%   BMI 29.18 kg/m  Abd: gravid, ND, FHT present, mild tenderness on exam Extr: no edema SVE: CERVIX: anterior lip cm dilated, 90 effaced, +1 station  EFM: FHR: 125 bpm, variability: moderate,  accelerations:  Present,  decelerations:  Absent Toco: Frequency: Every 2-4 minutes Labs: I have reviewed the patient's lab results.   Assessment & Plan:  G2P0010 @ [redacted]w[redacted]d, admitted for  Pregnancy and Labor/Delivery Management  1. Pain management: epidural. 2. FWB: FHT category I.  3. ID: GBS negative 4. Labor management: continue pitocin   All discussed with patient, see orders   [redacted]w[redacted]d, CNM [facility] 09/21/2020  1:07 PM

## 2020-09-21 NOTE — Progress Notes (Signed)
Audrey Cruz is a 25 y.o. G2P0010 at [redacted]w[redacted]d by ultrasound, who is now more comfortable with an epidural. Her  mother and husband are present and sleeping in the room near her. She is positioned sidelying with the "peanut ball".  Subjective:  Getting comfortable with her epidural. Rests on her left side.   Objective: BP 132/71 (BP Location: Left Arm)   Pulse 73   Temp 97.7 F (36.5 C) (Oral)   Resp (!) 22   Ht 5\' 6"  (1.676 m)   Wt 82 kg   LMP 01/06/2020 (Approximate)   SpO2 100%   BMI 29.18 kg/m  I/O last 3 completed shifts: In: 158.3 [P.O.:120; I.V.:38.3] Out: -  No intake/output data recorded.  FHT:  FHR: 115 baseline bpm, variability: moderate,  accelerations:  Present,  decelerations:  Present some early decels noted. UC:   regular, every 2-3 minutes SVE:   Dilation: 4.5 Effacement (%): 100 Station: 0, Plus 1 Exam by:: RS  Labs: Lab Results  Component Value Date   WBC 14.8 (H) 09/20/2020   HGB 9.5 (L) 09/20/2020   HCT 30.7 (L) 09/20/2020   MCV 75.1 (L) 09/20/2020   PLT 264 09/20/2020    Assessment / Plan: Induction of labor due to PROM,  progressing well on pitocin  Labor: Progressing normally  Fetal Wellbeing:  Category II Pain Control:  Epidural I/D:  n/a Anticipated MOD:  NSVD Will recheck in several hours.  09/22/2020 09/21/2020, 4:51 AM

## 2020-09-21 NOTE — Discharge Summary (Signed)
OB Discharge Summary     Patient Name: Audrey Cruz DOB: [birth year] MRN: [id number]  Date of admission: 09/20/2020 Delivering MD: Malachy Mood   Date of discharge: 09/23/2020  Admitting diagnosis: Amniotic fluid leaking [O42.90] Intrauterine pregnancy: [redacted]w[redacted]d    Secondary diagnosis:  Active Problems:   Amniotic fluid leaking   Arrested labor   Postpartum care following cesarean delivery  Additional problems: none     Discharge diagnosis: Term Pregnancy Delivered                                                                                                Post partum procedures: none  Augmentation: Pitocin  Complications: None  Hospital course:  Onset of Labor With Unplanned C/S   25y.o. yo G2P1011 at 311w4das admitted in Latent Labor on 09/20/2020. Patient had a labor course significant for SROM, pitocin augmentation with second stage arrest. The patient went for cesarean section due to Arrest of Descent. Delivery details as follows: Membrane Rupture Time/Date: 12:00 PM ,09/20/2020   Delivery Method:C-Section, Low Transverse  Details of operation can be found in separate operative note. Patient had an uncomplicated postpartum course.  She is ambulating,tolerating a regular diet, passing flatus, and urinating well.  Patient is discharged home in stable condition 09/23/20.  Newborn Data: Birth date:09/21/2020  Birth time:4:34 PM  Gender:Female  Living status:Living  Apgars:9 ,9  Weight:3850 g   Physical exam  Vitals:   09/22/20 1100 09/22/20 1202 09/22/20 1557 09/23/20 0923  BP:  111/64 122/79 126/87  Pulse: 87 88 76 80  Resp:  _0 Temp:  98.6 F (37 C) 97.9 F (36.6 C) 98.3 F (36.8 C)  TempSrc:  Oral Oral Oral  SpO2: 96% 99%  99%  Weight:      Height:       General: alert, cooperative, and no distress denies dizziness Lochia: appropriate Uterine Fundus: firm Incision: Healing well with no significant drainage, No significant erythema, Dressing  is clean, dry, and intact DVT Evaluation: No evidence of DVT seen on physical exam. Negative Homan's sign. Labs: Lab Results  Component Value Date   WBC 20.8 (H) 09/22/2020   HGB 7.7 (L) 09/22/2020   HCT 25.1 (L) 09/22/2020   MCV 75.4 (L) 09/22/2020   PLT 219 09/22/2020   CMP Latest Ref Rng & Units 08/04/2019  Glucose 70 - 99 mg/dL 87  BUN 6 - 20 mg/dL 9  Creatinine 0.44 - 1.00 mg/dL 0.58  Sodium 135 - 145 mmol/L 135  Potassium 3.5 - 5.1 mmol/L 3.1(L)  Chloride 98 - 111 mmol/L 102  CO2 22 - 32 mmol/L 23  Calcium 8.9 - 10.3 mg/dL 9.2    Discharge instruction: per After Visit Summary and "Baby and Me Booklet".  After visit meds:  Allergies as of 09/23/2020       Reactions   Amoxicillin Hives   Penicillins Hives        Medication List     STOP taking these medications    Accu-Chek Nano SmartView w/Device Kit   Accu-Chek SmartView test strip Generic drug:  glucose blood   Accu-Chek Softclix Lancets lancets       TAKE these medications    ibuprofen 600 MG tablet Commonly known as: ADVIL Take 1 tablet (600 mg total) by mouth every 6 (six) hours.   multivitamin tablet Take 1 tablet by mouth daily.   nitrofurantoin (macrocrystal-monohydrate) 100 MG capsule Commonly known as: MACROBID Take 1 capsule (100 mg total) by mouth 2 (two) times daily.   oxyCODONE-acetaminophen 5-325 MG tablet Commonly known as: PERCOCET/ROXICET Take 1-2 tablets by mouth every 4 (four) hours as needed for moderate pain or severe pain ((when tolerating fluids)).               Discharge Care Instructions  (From admission, onward)           Start     Ordered   09/23/20 0000  Leave dressing on - Keep it clean, dry, and intact until clinic visit        09/23/20 1041            Diet: routine diet  Activity: Advance as tolerated. Pelvic rest for 6 weeks.   Outpatient follow up:1 week Follow up Appt: Future Appointments  Date Time Provider Department Center   09/28/2020  1:50 PM Jackson, Stephen D, MD [facility] None   Follow up Visit:No follow-ups on file.  Postpartum contraception: Depo Provera  Newborn Data: Live born female  Birth Weight:   APGAR: ,   Newborn Delivery   Birth date/time: 09/21/2020 16:34:00 Delivery type: C-Section, Low Transverse Trial of labor: Yes C-section categorization: Primary      Baby Feeding: Breast Disposition:home with mother   09/23/2020 Margaret M Fryer, CNM     

## 2020-09-22 ENCOUNTER — Encounter: Payer: Self-pay | Admitting: Obstetrics and Gynecology

## 2020-09-22 LAB — CBC
HCT: 25.1 % — ABNORMAL LOW (ref 36.0–46.0)
Hemoglobin: 7.7 g/dL — ABNORMAL LOW (ref 12.0–15.0)
MCH: 23.1 pg — ABNORMAL LOW (ref 26.0–34.0)
MCHC: 30.7 g/dL (ref 30.0–36.0)
MCV: 75.4 fL — ABNORMAL LOW (ref 80.0–100.0)
Platelets: 219 10*3/uL (ref 150–400)
RBC: 3.33 MIL/uL — ABNORMAL LOW (ref 3.87–5.11)
RDW: 17 % — ABNORMAL HIGH (ref 11.5–15.5)
WBC: 20.8 10*3/uL — ABNORMAL HIGH (ref 4.0–10.5)
nRBC: 0 % (ref 0.0–0.2)

## 2020-09-22 NOTE — Lactation Note (Signed)
This note was copied from a baby's chart. Lactation Consultation Note  Patient Name: Audrey Cruz [id number] Date: 09/22/2020 Reason for consult: Initial assessment;Primapara;Term;Other (Comment) (c-section) Age:23 hours  Initial lactation visit. Mom is P1, c-section delivery 20hrs ago. Breastfeeding and output documented since delivery. Mom has noted some discomfort with feedings and states it feels like baby is chomping.  LC at bedside to initiate a feeding and evaluate baby. Baby is quiet alert, still swaddled, appearing to have a tight upper lip which may be causing the discomfort. Blankets were removed and feeding attempted. Colostrum is easily expressed at the breast, mom's tissue is compressible, however nipple appears to flatten with sandwiching. The times that baby does latch on he is able to bring out the nipple further, but doesn't maintain latch. LC changed void and stool diaper, brought baby back to breast for repeat feeding attempt hand expressing into baby's mouth; baby never fully became interested, placed on mom's chest where he fell asleep quickly.  LC encouraged frequent hand expression or use of hand pump at bedside to express colostrum. Educated mom that the EBM can be used along with time at the breast to ensure adequate intake, also reviewing typical breastfeeding behaviors of baby's <24hrs old. Encouraged continued frequent attempts, skin to skin, and hand expression.  Whiteboard updated w/ LC number, mom instructed to call out with next attempt to evaluate latch/position/feeding/transfer.   Maternal Data Has patient been taught Hand Expression?: Yes Does the patient have breastfeeding experience prior to this delivery?: No  Feeding Mother's Current Feeding Choice: Breast Milk and Formula  LATCH Score Latch: Repeated attempts needed to sustain latch, nipple held in mouth throughout feeding, stimulation needed to elicit sucking reflex.  Audible Swallowing:  None  Type of Nipple: Everted at rest and after stimulation  Comfort (Breast/Nipple): Soft / non-tender  Hold (Positioning): Assistance needed to correctly position infant at breast and maintain latch.  LATCH Score: 6   Lactation Tools Discussed/Used    Interventions Interventions: Breast feeding basics reviewed;Assisted with latch;Hand express;Adjust position;Support pillows;Education  Discharge    Consult Status Consult Status: Follow-up Date: 09/22/20 Follow-up type: In-patient    Danford Bad 09/22/2020, 1:08 PM

## 2020-09-22 NOTE — TOC Initial Note (Signed)
Transition of Care [facility]) - Initial/Assessment Note    Patient Details  Name: Audrey Cruz MRN: [id number] Date of Birth: 04/13/1995  Transition of [facility]) CM/SW Contact:    Hetty Ely, RN Phone Number: 09/22/2020, 12:59 PM  Clinical Narrative:  Spoke with MOB, who was holding NB and FOB asleep in the recliner. MOB says this is her first baby named Francesco Runner. MOB lives with FOB they are not married at the time. Discussed history of drug use, MOB says she used marijuana prior to knowing about her pregnancy and has not used since. Denies the need for SA resource list. MOB says she has everything needed for NB and is currently signed up for [facility] services.MOB did ask for Food Stamp informations, resource sheet given. MOB was informed that a CPS report will be submitted if NB cord results positive, MOB voices understanding.  No TOC needs assessed at this time can discharge when medically stable.                         Patient Goals and CMS Choice        Expected Discharge Plan and Services                                                Prior Living Arrangements/Services                       Activities of Daily Living Home Assistive Devices/Equipment: None ADL Screening (condition at time of admission) Patient's cognitive ability adequate to safely complete daily activities?: Yes Is the patient deaf or have difficulty hearing?: No Does the patient have difficulty seeing, even when wearing glasses/contacts?: No Does the patient have difficulty concentrating, remembering, or making decisions?: No Patient able to express need for assistance with ADLs?: Yes Does the patient have difficulty dressing or bathing?: No Independently performs ADLs?: Yes (appropriate for developmental age) Does the patient have difficulty walking or climbing stairs?: No Weakness of Legs: None Weakness of Arms/Hands: None  Permission Sought/Granted                   Emotional Assessment              Admission diagnosis:  Amniotic fluid leaking [O42.90] Patient Active Problem List   Diagnosis Date Noted   Arrested labor    Amniotic fluid leaking 09/20/2020   Supervision of other normal pregnancy, antepartum 04/22/2020   PCP:  Mickie Bail, MD Pharmacy:   [facility] [phone] Dan Humphre[place], [place] - [address] AT [facility] OF [address] [facility] [place] [address] Phone: [phone] Fax: [phone]     Social Determinants of Health (SDOH) Interventions    Readmission Risk Interventions No flowsheet data found.

## 2020-09-22 NOTE — Progress Notes (Signed)
Obstetric Postpartum/PostOperative Daily Progress Note Subjective:  25 y.o. G2P1011 post-operative day # 1 status post primary cesarean delivery.  She is ambulating, is tolerating po, is voiding spontaneously.  Her pain is well controlled on PO pain medications and On Q pump. Her lochia is less than menses. She reports breastfeeding is going well. She denies any s/s of anemia.   Medications SCHEDULED MEDICATIONS   acetaminophen  1,000 mg Oral Q6H   ketorolac  30 mg Intravenous Q6H   Followed by   ibuprofen  600 mg Oral Q6H   prenatal multivitamin  1 tablet Oral Q1200   senna-docusate  2 tablet Oral Q24H   simethicone  80 mg Oral TID PC   Tdap  0.5 mL Intramuscular Once    MEDICATION INFUSIONS   lactated ringers     naLOXone (NARCAN) adult infusion for PRURITIS     oxytocin 2.5 Units/hr (09/21/20 2150)    PRN MEDICATIONS  coconut oil, witch hazel-glycerin **AND** dibucaine, diphenhydrAMINE **OR** diphenhydrAMINE, diphenhydrAMINE, ketorolac **OR** ketorolac, menthol-cetylpyridinium, nalbuphine **OR** nalbuphine, nalbuphine **OR** nalbuphine, naloxone **AND** sodium chloride flush, naLOXone (NARCAN) adult infusion for PRURITIS, ondansetron (ZOFRAN) IV, oxyCODONE-acetaminophen, simethicone    Objective:   Vitals:   09/22/20 0500 09/22/20 0600 09/22/20 0818 09/22/20 0900  BP:   113/79   Pulse: 83 75 80 95  Resp:   18   Temp:   98 F (36.7 C)   TempSrc:      SpO2: 98% 97% 100% 100%  Weight:      Height:        Current Vital Signs 24h Vital Sign Ranges  T 98 F (36.7 C) Temp  Avg: 98 F (36.7 C)  Min: 97.6 F (36.4 C)  Max: 98.6 F (37 C)  BP 113/79 BP  Min: 83/68  Max: 130/98  HR 95 Pulse  Avg: 79.1  Min: 66  Max: 101  RR 18 Resp  Avg: 17  Min: 11  Max: 24  SaO2 100 % Room Air SpO2  Avg: 97.3 %  Min: 88 %  Max: 100 %       24 Hour I/O Current Shift I/O  Time Ins Outs 09/20 0701 - 09/21 0700 In: 1479.4 [I.V.:1479.4] Out: 4660 [Urine:4475] 09/21 0701 - 09/21 1900 In: -   Out: 200 [Urine:200]   General: NAD Pulmonary: no increased work of breathing Abdomen: non-distended, non-tender, fundus firm at level of umbilicus Inc: Clean/dry/intact, On Q intact Extremities: no edema, no erythema, no tenderness  Labs:  Recent Labs  Lab 09/20/20 1645 09/22/20 0551  WBC 14.8* 20.8*  HGB 9.5* 7.7*  HCT 30.7* 25.1*  PLT 264 219     Assessment:   25 y.o. G2P1011 postoperative day # 1 status post primary cesarean section, lactating  Plan:  1) Acute blood loss anemia - hemodynamically stable and asymptomatic - po ferrous sulfate  2) A POS / Rubella 1.71 (07/18 1022)/ Varicella Immune  3) TDAP status needs prior to surgery  4) Breastfeeding  5) Contraception = Depo-Provera  6) Disposition: continue current care   Tresea Mall, CNM 09/22/2020 10:25 AM

## 2020-09-23 MED ORDER — FERROUS SULFATE 325 (65 FE) MG PO TABS
325.0000 mg | ORAL_TABLET | Freq: Every day | ORAL | Status: DC
  Administered 2020-09-23: 325 mg via ORAL
  Filled 2020-09-23: qty 1

## 2020-09-23 MED ORDER — FERROUS SULFATE 325 (65 FE) MG PO TABS: 325.0000 mg | ORAL_TABLET | Freq: Three times a day (TID) | ORAL | Status: DC

## 2020-09-23 MED ORDER — IBUPROFEN 600 MG PO TABS: 600.0000 mg | ORAL_TABLET | Freq: Four times a day (QID) | ORAL | 0 refills | Status: DC

## 2020-09-23 MED ORDER — OXYCODONE-ACETAMINOPHEN 5-325 MG PO TABS: 1.0000 | ORAL_TABLET | ORAL | 0 refills | Status: DC | PRN

## 2020-09-23 NOTE — Lactation Note (Signed)
This note was copied from a baby's chart. Lactation Consultation Note  Patient Name: Audrey Cruz [id number] Date: 09/23/2020 Reason for consult: Follow-up assessment;Mother's request;Primapara;Term Age:23 hours  Lactation follow-up. Baby fed well overnight, nipple shield was given to assist with maintaining a latch. Baby appears to have a tighter upper lip, but LC was unable to assess yesterday or this morning due to infant being sleepy.  No feedings observed by LC, however Rns have noted feedings, and LC has been able to hand express copious amounts of colostrum and now transitional milk.   With infant sleepy post circumcision mom is feeling tightness in her breasts, specifically her L breast. LC was able to hand express approximately 107mL <103mins from that side with notable difference in heaviness. Mom encouraged to hand express bilaterally as needed for comfort.  Reviewed feeding on demand and with early cues, discussed cluster feeding, milk supply and demand, and normal course of lactation.   Mom given guidance on anticipated breast changes, management of fullness, softening of breast tissue prior to latching, and nipple care. Information for outpatient services provided and community breastfeeding support. Encouraged to call w/ questions/concerns and for ongoing BF support as needed.  Maternal Data Has patient been taught Hand Expression?: Yes Does the patient have breastfeeding experience prior to this delivery?: No  Feeding Mother's Current Feeding Choice: Breast Milk  LATCH Score                    Lactation Tools Discussed/Used    Interventions Interventions: Hand express;Breast feeding basics reviewed;Pre-pump if needed;Breast compression;Education (nipple shield use; hand expressed 36mL)  Discharge Discharge Education: Engorgement and breast care;Warning signs for feeding baby;Outpatient recommendation  Consult Status Consult Status: Complete Date:  09/23/20 Follow-up type: Call as needed    Danford Bad 09/23/2020, 12:02 PM

## 2020-09-23 NOTE — Progress Notes (Signed)
Pt discharged with infant. Discharge instructions, prescriptions, and follow up appointments given to and reviewed with patient. Pt verbalized understanding. Escorted out by auxillary.  

## 2020-09-28 ENCOUNTER — Encounter: Payer: Medicaid Other | Admitting: Obstetrics and Gynecology

## 2020-09-29 ENCOUNTER — Ambulatory Visit (INDEPENDENT_AMBULATORY_CARE_PROVIDER_SITE_OTHER): Payer: Medicaid Other | Admitting: Obstetrics and Gynecology

## 2020-09-29 ENCOUNTER — Other Ambulatory Visit: Payer: Self-pay

## 2020-09-29 ENCOUNTER — Encounter: Payer: Self-pay | Admitting: Obstetrics and Gynecology

## 2020-09-29 VITALS — BP 119/85 | Ht 64.0 in | Wt 162.0 lb

## 2020-09-29 DIAGNOSIS — Z4889 Encounter for other specified surgical aftercare: Secondary | ICD-10-CM

## 2020-09-29 MED ORDER — MEDROXYPROGESTERONE ACETATE 150 MG/ML IM SUSP: 150.0000 mg | INTRAMUSCULAR | 3 refills | Status: DC

## 2020-09-29 MED ORDER — OXYCODONE-ACETAMINOPHEN 5-325 MG PO TABS: 1.0000 | ORAL_TABLET | ORAL | 0 refills | Status: DC | PRN

## 2020-09-29 NOTE — Progress Notes (Signed)
Postoperative Follow-up Patient presents post op from 1LTCS 1weeks ago for  second stage arrest .  Subjective: Patient reports some improvement in her preop symptoms. Eating a regular diet without difficulty. Pain is not well controlled.  Medications being used: prescription NSAID's including ibuprofen (Motrin) and narcotic analgesics including oxycodone/acetaminophen (Percocet, Tylox).  Activity: normal activities of daily living.  Objective: Blood pressure 119/85, height 5\' 4"  (1.626 m), weight 162 lb (73.5 kg), unknown if currently breastfeeding.  General: NAD Pulmonary: no increased work of breathing Abdomen: soft, non-tender, non-distended, incision D/C/I Extremities: no edema Neurologic: normal gait   Admission on 09/20/2020, Discharged on 09/23/2020  Component Date Value Ref Range Status   Rom Plus 09/20/2020 POSITIVE   Final   Performed at [facility], [address] [place], [place]   WBC 09/20/2020 14.8 (A) 4.0 - 10.5 K/uL Final   RBC 09/20/2020 4.09  3.87 - 5.11 MIL/uL Final   Hemoglobin 09/20/2020 9.5 (A) 12.0 - 15.0 g/dL Final   HCT 09/22/2020 30.7 (A) 36.0 - 46.0 % Final   MCV 09/20/2020 75.1 (A) 80.0 - 100.0 fL Final   MCH 09/20/2020 23.2 (A) 26.0 - 34.0 pg Final   MCHC 09/20/2020 30.9  30.0 - 36.0 g/dL Final   RDW 09/22/2020 17.0 (A) 11.5 - 15.5 % Final   Platelets 09/20/2020 264  150 - 400 K/uL Final   nRBC 09/20/2020 0.1  0.0 - 0.2 % Final   Performed at [facility], [address] [place] [place], [place]   ABO/RH(D) 09/20/2020 A POS   Final   Antibody Screen 09/20/2020 NEG   Final   Sample Expiration 09/20/2020    Final                   Value:09/23/2020,2359 Performed at [facility], [address] [place], [place]    RPR Ser Ql 09/20/2020 NON REACTIVE  NON REACTIVE Final   Performed at [facility], [address] [place], [place]   SARS Coronavirus 2 by RT PCR 09/20/2020  NEGATIVE  NEGATIVE Final   Comment: (NOTE) SARS-CoV-2 target nucleic acids are NOT DETECTED.  The SARS-CoV-2 RNA is generally detectable in upper respiratory specimens during the acute phase of infection. The lowest concentration of SARS-CoV-2 viral copies this assay can detect is 138 copies/mL. A negative result does not preclude SARS-Cov-2 infection and should not be used as the sole basis for treatment or other patient management decisions. A negative result may occur with  improper specimen collection/handling, submission of specimen other than nasopharyngeal swab, presence of viral mutation(s) within the areas targeted by this assay, and inadequate number of viral copies(<138 copies/mL). A negative result must be combined with clinical observations, patient history, and epidemiological information. The expected result is Negative.  Fact Sheet for Patients:  09/22/2020  Fact Sheet for Healthcare Providers:  BloggerCourse.com  This test is no                          t yet approved or cleared by the [place] FDA and  has been authorized for detection and/or diagnosis of SARS-CoV-2 by FDA under an Emergency Use Authorization (EUA). This EUA will remain  in effect (meaning this test can be used) for the duration of the COVID-19 declaration under Section 564(b)(1) of the Act, 21 U.S.C.section 360bbb-3(b)(1), unless the authorization is terminated  or revoked sooner.  Influenza A by PCR 09/20/2020 NEGATIVE  NEGATIVE Final   Influenza B by PCR 09/20/2020 NEGATIVE  NEGATIVE Final   Comment: (NOTE) The Xpert Xpress SARS-CoV-2/FLU/RSV plus assay is intended as an aid in the diagnosis of influenza from Nasopharyngeal swab specimens and should not be used as a sole basis for treatment. Nasal washings and aspirates are unacceptable for Xpert Xpress SARS-CoV-2/FLU/RSV testing.  Fact Sheet for  Patients: BloggerCourse.com  Fact Sheet for Healthcare Providers: SeriousBroker.it  This test is not yet approved or cleared by the [place] FDA and has been authorized for detection and/or diagnosis of SARS-CoV-2 by FDA under an Emergency Use Authorization (EUA). This EUA will remain in effect (meaning this test can be used) for the duration of the COVID-19 declaration under Section 564(b)(1) of the Act, 21 U.S.C. section 360bbb-3(b)(1), unless the authorization is terminated or revoked.  Performed at [facility], [address] [place], [place] [address]    Glucose-Capillary 09/20/2020 72  70 - 99 mg/dL Final   Glucose reference range applies only to samples taken after fasting for at least 8 hours.   Gonorrhea 09/07/2020 Negative   Final   Tricyclic, Ur Screen 09/21/2020 NONE DETECTED  NONE DETECTED Final   Amphetamines, Ur Screen 09/21/2020 NONE DETECTED  NONE DETECTED Final   MDMA (Ecstasy)Ur Screen 09/21/2020 NONE DETECTED  NONE DETECTED Final   Cocaine Metabolite,Ur [place] 09/21/2020 NONE DETECTED  NONE DETECTED Final   Opiate, Ur Screen 09/21/2020 NONE DETECTED  NONE DETECTED Final   Phencyclidine (PCP) Ur S 09/21/2020 NONE DETECTED  NONE DETECTED Final   Cannabinoid 50 Ng, Ur [place] 09/21/2020 NONE DETECTED  NONE DETECTED Final   Barbiturates, Ur Screen 09/21/2020 NONE DETECTED  NONE DETECTED Final   Benzodiazepine, Ur Scrn 09/21/2020 NONE DETECTED  NONE DETECTED Final   Methadone Scn, Ur 09/21/2020 NONE DETECTED  NONE DETECTED Final   Comment: (NOTE) Tricyclics + metabolites, urine    Cutoff 1000 ng/mL Amphetamines + metabolites, urine  Cutoff 1000 ng/mL MDMA (Ecstasy), urine              Cutoff 500 ng/mL Cocaine Metabolite, urine          Cutoff 300 ng/mL Opiate + metabolites, urine        Cutoff 300 ng/mL Phencyclidine (PCP), urine         Cutoff 25 ng/mL Cannabinoid, urine                 Cutoff 50  ng/mL Barbiturates + metabolites, urine  Cutoff 200 ng/mL Benzodiazepine, urine              Cutoff 200 ng/mL Methadone, urine                   Cutoff 300 ng/mL  The urine drug screen provides only a preliminary, unconfirmed analytical test result and should not be used for non-medical purposes. Clinical consideration and professional judgment should be applied to any positive drug screen result due to possible interfering substances. A more specific alternate chemical method must be used in order to obtain a confirmed analytical result. Gas chromatography / mass spectrometry (GC/MS) is the preferred confirm                          atory method. Performed at [facility], [address] [place], [place] [address]    WBC 09/22/2020 20.8 (A) 4.0 - 10.5 K/uL Final   RBC 09/22/2020 3.33 (A)  3.87 - 5.11 MIL/uL Final   Hemoglobin 09/22/2020 7.7 (A) 12.0 - 15.0 g/dL Final   Comment: Reticulocyte Hemoglobin testing may be clinically indicated, consider ordering this additional test [id number]    HCT 09/22/2020 25.1 (A) 36.0 - 46.0 % Final   MCV 09/22/2020 75.4 (A) 80.0 - 100.0 fL Final   MCH 09/22/2020 23.1 (A) 26.0 - 34.0 pg Final   MCHC 09/22/2020 30.7  30.0 - 36.0 g/dL Final   RDW 54/09/8117 17.0 (A) 11.5 - 15.5 % Final   Platelets 09/22/2020 219  150 - 400 K/uL Final   nRBC 09/22/2020 0.0  0.0 - 0.2 % Final   Performed at [facility], [address] [place], [place] [address]    Assessment: 25 y.o. s/p 1LTCS stable  Plan: Patient has done well after surgery with no apparent complications.  I have discussed the post-operative course to date, and the expected progress moving forward.  The patient understands what complications to be concerned about.  I will see the patient in routine follow up, or sooner if needed.    Activity plan: No heavy lifting.  Refill percocet  Rx depo provera   Vena Austria, MD, [facility], [facility] 09/29/2020, 10:09 AM

## 2020-11-04 ENCOUNTER — Other Ambulatory Visit: Payer: Self-pay

## 2020-11-04 ENCOUNTER — Ambulatory Visit (INDEPENDENT_AMBULATORY_CARE_PROVIDER_SITE_OTHER): Payer: Medicaid Other | Admitting: Obstetrics and Gynecology

## 2020-11-04 ENCOUNTER — Encounter: Payer: Self-pay | Admitting: Obstetrics and Gynecology

## 2020-11-04 MED ORDER — MEDROXYPROGESTERONE ACETATE 150 MG/ML IM SUSP: 150.0000 mg | INTRAMUSCULAR | 3 refills | Status: DC

## 2020-11-04 NOTE — Progress Notes (Signed)
Postpartum Visit  Chief Complaint: No chief complaint on file.   History of Present Illness: Patient is a 25 y.o. G2P1011 presents for postpartum visit.  Date of delivery: 09/21/2020 Cesarean Section: Second stage arrest Pregnancy or labor problems:  no Any problems since the delivery:  no  Newborn Details:  SINGLETON :  1. BabyGender female. Birth weight: 8lbs 8oz Maternal Details:  Breast or formula feeding: plans to breastfeed Intercourse: No  Contraception after delivery: No  Any bowel or bladder issues: No  Post partum depression/anxiety noted:  no Edinburgh Post-Partum Depression Score:0 Date of last PAP: 11/18/2021no abnormalities   Review of Systems: Review of Systems  Constitutional: Negative.   Gastrointestinal: Negative.   Genitourinary: Negative.   Skin: Negative.   Psychiatric/Behavioral: Negative.     The following portions of the patient's history were reviewed and updated as appropriate: allergies, current medications, past family history, past medical history, past social history, past surgical history, and problem list.  Past Medical History:  Past Medical History:  Diagnosis Date   Medical history non-contributory    Miscarriage     Past Surgical History:  Past Surgical History:  Procedure Laterality Date   CESAREAN SECTION  09/21/2020   Procedure: CESAREAN SECTION;  Surgeon: Vena Austria, MD;  Location: [facility] ORS;  Service: Obstetrics;;   DILATION AND EVACUATION N/A 11/26/2019   Procedure: DILATATION AND EVACUATION;  Surgeon: Natale Milch, MD;  Location: [facility] ORS;  Service: Gynecology;  Laterality: N/A;   INDUCED ABORTION      Family History:  Family History  Problem Relation Age of Onset   Thyroid disease Mother    Hepatitis C Mother    Healthy Father     Social History:  Social History   Socioeconomic History   Marital status: Significant Other    Spouse name: Janyth Pupa   Number of children: Not on file   Years of  education: Not on file   Highest education level: Not on file  Occupational History   Not on file  Tobacco Use   Smoking status: Never   Smokeless tobacco: Never  Vaping Use   Vaping Use: Former  Substance and Sexual Activity   Alcohol use: No   Drug use: Yes    Types: Marijuana   Sexual activity: Yes    Birth control/protection: None    Comment: planning depo  Other Topics Concern   Not on file  Social History Narrative   Not on file   Social Determinants of Health   Financial Resource Strain: Not on file  Food Insecurity: Not on file  Transportation Needs: Not on file  Physical Activity: Not on file  Stress: Not on file  Social Connections: Not on file  Intimate Partner Violence: Not on file    Allergies:  Allergies  Allergen Reactions   Amoxicillin Hives   Penicillins Hives    Medications: Prior to Admission medications   Medication Sig Start Date End Date Taking? Authorizing Provider  ibuprofen (ADVIL) 600 MG tablet Take 1 tablet (600 mg total) by mouth every 6 (six) hours. 09/23/20   Mirna Mires, CNM  medroxyPROGESTERone (DEPO-PROVERA) 150 MG/ML injection Inject 1 mL (150 mg total) into the muscle every 3 (three) months. 09/29/20 12/28/20  Vena Austria, MD  Multiple Vitamin (MULTIVITAMIN) tablet Take 1 tablet by mouth daily.    [provider]  nitrofurantoin, macrocrystal-monohydrate, (MACROBID) 100 MG capsule Take 1 capsule (100 mg total) by mouth 2 (two) times daily. 08/10/20  Mirna Mires, CNM  oxyCODONE-acetaminophen (PERCOCET/ROXICET) 5-325 MG tablet Take 1 tablet by mouth every 4 (four) hours as needed for moderate pain or severe pain ((when tolerating fluids)). 09/29/20   Vena Austria, MD    Physical Exam Blood pressure 106/70, height 5\' 4"  (1.626 m), weight 159 lb (72.1 kg), currently breastfeeding.   General: NAD HEENT: normocephalic, anicteric Pulmonary: No increased work of breathing Abdomen: NABS, soft, non-tender,  non-distended.  Umbilicus without lesions.  No hepatomegaly, splenomegaly or masses palpable. No evidence of hernia. Incision well healed Genitourinary:  External: Normal external female genitalia.  Normal urethral meatus, normal  Bartholin's and Skene's glands.    Vagina: Normal vaginal mucosa, no evidence of prolapse.    Cervix: Grossly normal in appearance, no bleeding  Uterus: Non-enlarged, mobile, normal contour.  No CMT  Adnexa: ovaries non-enlarged, no adnexal masses  Rectal: deferred Extremities: no edema, erythema, or tenderness Neurologic: Grossly intact Psychiatric: mood appropriate, affect full    Assessment: 25 y.o. G2P1011 presenting for 6 week postpartum visit  Plan: Problem List Items Addressed This Visit   None   1) Contraception - Education given regarding options for contraception, as well as compatibility with breast feeding if applicable.  Patient plans on Depo-Provera injections for contraception.  2)  Pap - ASCCP guidelines and rational discussed.  ASCCP guidelines and rational discussed.  Patient opts for every 3 years screening interval  3) Patient underwent screening for postpartum depression with no signs of depression  4) No follow-ups on file.   30, MD, Vena Austria [facility], [facility] 11/04/2020, 3:06 PM

## 2020-11-05 ENCOUNTER — Ambulatory Visit (INDEPENDENT_AMBULATORY_CARE_PROVIDER_SITE_OTHER): Payer: Medicaid Other

## 2020-11-05 DIAGNOSIS — Z3042 Encounter for surveillance of injectable contraceptive: Secondary | ICD-10-CM | POA: Diagnosis not present

## 2020-11-05 MED ORDER — MEDROXYPROGESTERONE ACETATE 150 MG/ML IM SUSP
150.0000 mg | Freq: Once | INTRAMUSCULAR | Status: AC
  Administered 2020-11-05: 150 mg via INTRAMUSCULAR

## 2020-11-05 NOTE — Progress Notes (Signed)
Patient presents today for Depo Provera injection within dates. Given IM RUOQ. Patient tolerated well. 

## 2021-02-07 ENCOUNTER — Ambulatory Visit: Payer: Medicaid Other

## 2021-02-15 ENCOUNTER — Ambulatory Visit: Payer: Medicaid Other

## 2021-02-16 ENCOUNTER — Ambulatory Visit (INDEPENDENT_AMBULATORY_CARE_PROVIDER_SITE_OTHER): Payer: Medicaid Other

## 2021-02-16 ENCOUNTER — Other Ambulatory Visit: Payer: Self-pay

## 2021-02-16 DIAGNOSIS — Z3042 Encounter for surveillance of injectable contraceptive: Secondary | ICD-10-CM | POA: Diagnosis not present

## 2021-02-16 LAB — POCT URINE PREGNANCY: Preg Test, Ur: NEGATIVE

## 2021-02-16 MED ORDER — MEDROXYPROGESTERONE ACETATE 150 MG/ML IM SUSP
150.0000 mg | Freq: Once | INTRAMUSCULAR | Status: AC
  Administered 2021-02-16: 150 mg via INTRAMUSCULAR

## 2021-03-07 ENCOUNTER — Other Ambulatory Visit: Payer: Self-pay

## 2021-03-07 ENCOUNTER — Ambulatory Visit
Admission: EM | Admit: 2021-03-07 | Discharge: 2021-03-07 | Disposition: A | Discharge status: Home / Self Care | Payer: Medicaid Other | Attending: Student | Admitting: Student

## 2021-03-07 DIAGNOSIS — B349 Viral infection, unspecified: Secondary | ICD-10-CM

## 2021-03-07 MED ORDER — IPRATROPIUM BROMIDE 0.03 % NA SOLN: 2.0000 | Freq: Two times a day (BID) | NASAL | 1 refills | Status: DC

## 2021-03-07 NOTE — Discharge Instructions (Addendum)
-  Atrovent nasal spray twice daily while symptoms persist ?-Continue OTC medications for additional relief ?-With a virus, you're typically contagious for 5-7 days, or as long as you're having fevers.  ? ?

## 2021-03-07 NOTE — ED Triage Notes (Signed)
Patient presents to Urgent Care with complaints of cough, runny nose, and bilateral ear pain x 2 days ago. Reports mild drainage form left ear. Not treating symptoms with meds.  ? ?Denies fever.  ?

## 2021-03-07 NOTE — ED Provider Notes (Signed)
?[facility] ? ? ? ?CSN: [id number] ?Arrival date & time: 03/07/21  1440 ? ? ?  ? ?History   ?Chief Complaint ?Chief Complaint  ?Patient presents with  ? Cough  ? Nasal Congestion  ? Otalgia  ? ? ?HPI ?Audrey Cruz is a 26 y.o. female presenting with nasal congestion and bilateral ear pressure x2 days. Some mild drainage from L ear - states this looks like earwax. Has attempted various over-the-counter medications like Mucinex DM with some relief.  Denies history of asthma or similar, denies shortness of breath, chest pain, fever/chills, wheezing.  Tolerating fluids and food.  Denies known sick exposure. ? ?HPI ? ?Past Medical History:  ?Diagnosis Date  ? Medical history non-contributory   ? Miscarriage   ? ? ?Patient Active Problem List  ? Diagnosis Date Noted  ? Postpartum care following cesarean delivery 09/23/2020  ? Arrested labor   ? Amniotic fluid leaking 09/20/2020  ? Supervision of other normal pregnancy, antepartum 04/22/2020  ? ? ?Past Surgical History:  ?Procedure Laterality Date  ? CESAREAN SECTION  09/21/2020  ? Procedure: CESAREAN SECTION;  Surgeon: Vena Austria, MD;  Location: [facility] ORS;  Service: Obstetrics;;  ? DILATION AND EVACUATION N/A 11/26/2019  ? Procedure: DILATATION AND EVACUATION;  Surgeon: Natale Milch, MD;  Location: [facility] ORS;  Service: Gynecology;  Laterality: N/A;  ? INDUCED ABORTION    ? ? ?OB History   ? ? Gravida  ?2  ? Para  ?1  ? Term  ?1  ? Preterm  ?   ? AB  ?1  ? Living  ?1  ?  ? ? SAB  ?1  ? IAB  ?   ? Ectopic  ?   ? Multiple  ?0  ? Live Births  ?1  ?   ?  ?  ? ? ? ?Home Medications   ? ?Prior to Admission medications   ?Medication Sig Start Date End Date Taking? Authorizing Provider  ?ipratropium (ATROVENT) 0.03 % nasal spray Place 2 sprays into both nostrils every 12 (twelve) hours. 03/07/21  Yes Rhys Martini, PA-C  ?ibuprofen (ADVIL) 600 MG tablet Take 1 tablet (600 mg total) by mouth every 6 (six) hours. 09/23/20   Mirna Mires, CNM   ?medroxyPROGESTERone (DEPO-PROVERA) 150 MG/ML injection Inject 1 mL (150 mg total) into the muscle every 3 (three) months. 11/04/20 02/02/21  Vena Austria, MD  ?Multiple Vitamin (MULTIVITAMIN) tablet Take 1 tablet by mouth daily.    [provider]  ? ? ?Family History ?Family History  ?Problem Relation Age of Onset  ? Thyroid disease Mother   ? Hepatitis C Mother   ? Healthy Father   ? ? ?Social History ?Social History  ? ?Tobacco Use  ? Smoking status: Never  ? Smokeless tobacco: Never  ?Vaping Use  ? Vaping Use: Former  ? Quit date: 03/07/2020  ?Substance Use Topics  ? Alcohol use: No  ? Drug use: Not Currently  ?  Types: Marijuana  ? ? ? ?Allergies   ?Amoxicillin and Penicillins ? ? ?Review of Systems ?Review of Systems  ?Constitutional:  Negative for appetite change, chills and fever.  ?HENT:  Positive for congestion. Negative for ear pain, rhinorrhea, sinus pressure, sinus pain and sore throat.   ?Eyes:  Negative for redness and visual disturbance.  ?Respiratory:  Positive for cough. Negative for chest tightness, shortness of breath and wheezing.   ?Cardiovascular:  Negative for chest pain and palpitations.  ?Gastrointestinal:  Negative for  abdominal pain, constipation, diarrhea, nausea and vomiting.  ?Genitourinary:  Negative for dysuria, frequency and urgency.  ?Musculoskeletal:  Negative for myalgias.  ?Neurological:  Negative for dizziness, weakness and headaches.  ?Psychiatric/Behavioral:  Negative for confusion.   ?All other systems reviewed and are negative. ? ? ?Physical Exam ?Triage Vital Signs ?ED Triage Vitals  ?Enc Vitals Group  ?   BP 03/07/21 1504 126/90  ?   Pulse Rate 03/07/21 1504 (!) 120  ?   Resp 03/07/21 1504 16  ?   Temp 03/07/21 1504 99.8 ?F (37.7 ?C)  ?   Temp Source 03/07/21 1504 Oral  ?   SpO2 03/07/21 1504 98 %  ?   Weight --   ?   Height --   ?   Head Circumference --   ?   Peak Flow --   ?   Pain Score 03/07/21 1506 0  ?   Pain Loc --   ?   Pain Edu? --   ?   Excl. in GC?  --   ? ?No data found. ? ?Updated Vital Signs ?BP 126/90 (BP Location: Left Arm)   Pulse (!) 120   Temp 99.8 ?F (37.7 ?C) (Oral)   Resp 16   SpO2 98%   Breastfeeding No  ? ?Visual Acuity ?Right Eye Distance:   ?Left Eye Distance:   ?Bilateral Distance:   ? ?Right Eye Near:   ?Left Eye Near:    ?Bilateral Near:    ? ?Physical Exam ?Vitals reviewed.  ?Constitutional:   ?   General: She is not in acute distress. ?   Appearance: Normal appearance. She is not ill-appearing.  ?HENT:  ?   Head: Normocephalic and atraumatic.  ?   Right Ear: Tympanic membrane, ear canal and external ear normal. No tenderness. No middle ear effusion. There is no impacted cerumen. Tympanic membrane is not perforated, erythematous, retracted or bulging.  ?   Left Ear: Tympanic membrane, ear canal and external ear normal. No tenderness.  No middle ear effusion. There is no impacted cerumen. Tympanic membrane is not perforated, erythematous, retracted or bulging.  ?   Nose: Nose normal. No congestion.  ?   Mouth/Throat:  ?   Mouth: Mucous membranes are moist.  ?   Pharynx: Uvula midline. No oropharyngeal exudate or posterior oropharyngeal erythema.  ?Eyes:  ?   Extraocular Movements: Extraocular movements intact.  ?   Pupils: Pupils are equal, round, and reactive to light.  ?Cardiovascular:  ?   Rate and Rhythm: Normal rate and regular rhythm.  ?   Heart sounds: Normal heart sounds.  ?Pulmonary:  ?   Effort: Pulmonary effort is normal.  ?   Breath sounds: Normal breath sounds. No decreased breath sounds, wheezing, rhonchi or rales.  ?Abdominal:  ?   Palpations: Abdomen is soft.  ?   Tenderness: There is no abdominal tenderness. There is no guarding or rebound.  ?Lymphadenopathy:  ?   Cervical: No cervical adenopathy.  ?   Right cervical: No superficial cervical adenopathy. ?   Left cervical: No superficial cervical adenopathy.  ?Neurological:  ?   General: No focal deficit present.  ?   Mental Status: She is alert and oriented to person,  place, and time.  ?Psychiatric:     ?   Mood and Affect: Mood normal.     ?   Behavior: Behavior normal.     ?   Thought Content: Thought content normal.     ?  Judgment: Judgment normal.  ? ? ? ?UC Treatments / Results  ?Labs ?(all labs ordered are listed, but only abnormal results are displayed) ?Labs Reviewed - No data to display ? ?EKG ? ? ?Radiology ?No results found. ? ?Procedures ?Procedures (including critical care time) ? ?Medications Ordered in UC ?Medications - No data to display ? ?Initial Impression / Assessment and Plan / UC Course  ?I have reviewed the triage vital signs and the nursing notes. ? ?Pertinent labs & imaging results that were available during my care of the patient were reviewed by me and considered in my medical decision making (see chart for details). ? ?  ? ?This patient is a very pleasant 26 y.o. year old female presenting with viral syndrome x2 days. Afebrile, tachy but I suspect this vital sign was entered in error; she was unfortunately discharged before this could be rechecked. ? ?Atrovent nasal spray sent, continue OTC medications for additional relief. Injection contraception.  ? ?ED return precautions discussed. Patient verbalizes understanding and agreement.  ? ? ?Final Clinical Impressions(s) / UC Diagnoses  ? ?Final diagnoses:  ?Viral syndrome  ? ? ? ?Discharge Instructions   ? ?  ?-Atrovent nasal spray twice daily while symptoms persist ?-Continue OTC medications for additional relief ?-With a virus, you're typically contagious for 5-7 days, or as long as you're having fevers.  ? ? ? ? ? ?ED Prescriptions   ? ? Medication Sig Dispense Auth. Provider  ? ipratropium (ATROVENT) 0.03 % nasal spray Place 2 sprays into both nostrils every 12 (twelve) hours. 30 mL Rhys Martini, PA-C  ? ?  ? ?PDMP not reviewed this encounter. ?  ?Rhys Martini, PA-C ?03/07/21 1620 ? ?

## 2021-05-23 ENCOUNTER — Telehealth: Payer: Self-pay

## 2021-05-23 NOTE — Telephone Encounter (Signed)
Patient is requesting nexplanon placement on 05/26/21 at 10:15 am with [facility]

## 2021-05-25 NOTE — Telephone Encounter (Signed)
Noted. Nexplanon reserved for this patient. 

## 2021-05-26 ENCOUNTER — Ambulatory Visit (INDEPENDENT_AMBULATORY_CARE_PROVIDER_SITE_OTHER): Payer: Medicaid Other | Admitting: Obstetrics

## 2021-05-26 ENCOUNTER — Encounter: Payer: Self-pay | Admitting: Obstetrics

## 2021-05-26 VITALS — BP 112/64 | Ht 64.0 in | Wt 162.0 lb

## 2021-05-26 DIAGNOSIS — N912 Amenorrhea, unspecified: Secondary | ICD-10-CM

## 2021-05-26 DIAGNOSIS — Z30011 Encounter for initial prescription of contraceptive pills: Secondary | ICD-10-CM

## 2021-05-26 DIAGNOSIS — Z3009 Encounter for other general counseling and advice on contraception: Secondary | ICD-10-CM

## 2021-05-26 LAB — POCT URINE PREGNANCY: Preg Test, Ur: NEGATIVE

## 2021-05-26 MED ORDER — SLYND 4 MG PO TABS: 1.0000 | ORAL_TABLET | Freq: Every day | ORAL | 1 refills | Status: DC

## 2021-05-26 MED ORDER — SLYND 4 MG PO TABS: 1.0000 | ORAL_TABLET | Freq: Every day | ORAL | 0 refills | Status: DC

## 2021-05-26 NOTE — Progress Notes (Signed)
Chief Complaint  Patient presents with   Contraception    Undecided between Nexplanon and [facility] pills   Patient Audrey Cruz is an 26 y.o. year old G2P1011 No LMP recorded. Patient has had an injection. currently Depo for contraception who presents for initially nexplanon insertion but she is also considering pills. The depo has affected her mood and she does not want anything that will affect her mood.   Past Medical History:  Diagnosis Date   Medical history non-contributory    Miscarriage    Past Surgical History:  Procedure Laterality Date   CESAREAN SECTION  09/21/2020   Procedure: CESAREAN SECTION;  Surgeon: Malachy Mood, MD;  Location: [facility] ORS;  Service: Obstetrics;;   DILATION AND EVACUATION N/A 11/26/2019   Procedure: DILATATION AND EVACUATION;  Surgeon: Homero Fellers, MD;  Location: [facility] ORS;  Service: Gynecology;  Laterality: N/A;   INDUCED ABORTION     Family History  Problem Relation Age of Onset   Thyroid disease Mother    Hepatitis C Mother    Healthy Father    Social History   Socioeconomic History   Marital status: Significant Other    Spouse name: Hart Carwin   Number of children: Not on file   Years of education: Not on file   Highest education level: Not on file  Occupational History   Not on file  Tobacco Use   Smoking status: Never   Smokeless tobacco: Never  Vaping Use   Vaping Use: Former   Quit date: 03/07/2020  Substance and Sexual Activity   Alcohol use: No   Drug use: Not Currently    Types: Marijuana   Sexual activity: Yes    Birth control/protection: None, Condom  Other Topics Concern   Not on file  Social History Narrative   Not on file   Social Determinants of Health   Financial Resource Strain: Not on file  Food Insecurity: Not on file  Transportation Needs: Not on file  Physical Activity: Not on file  Stress: Not on file  Social Connections: Not on file  Intimate Partner Violence: Not on file    Medicine list and  allergies reviewed and updated.    BP 112/64   Ht 5\' 4"  (1.626 m)   Wt 162 lb (73.5 kg)   Breastfeeding No   BMI 27.81 kg/m  Physical Exam Vitals and nursing note reviewed.  Constitutional:      Appearance: Normal appearance.  HENT:     Head: Normocephalic and atraumatic.  Eyes:     Extraocular Movements: Extraocular movements intact.  Pulmonary:     Effort: Pulmonary effort is normal.  Musculoskeletal:        General: Normal range of motion.     Cervical back: Normal range of motion.  Skin:    General: Skin is warm and dry.  Neurological:     General: No focal deficit present.     Mental Status: She is alert and oriented to person, place, and time.  Psychiatric:        Mood and Affect: Mood normal.        Behavior: Behavior normal.        Thought Content: Thought content normal.    Birth control counseling  Amenorrhea - Plan: POCT urine pregnancy  Initiation of oral contraception - Plan: Drospirenone (SLYND) 4 MG TABS  We have discussed that nonhormonal options are preferred to protect again mood changes I would recommend paragard We discussed the different progesterones in contrceptives  She would like trial of slynd Sample given and she will return in 3 mo.   Return in about 3 months (around 08/26/2021) for contraception follow up.

## 2021-05-26 NOTE — Patient Instructions (Signed)
Have a great year! Please call with any concerns. Don't forget to wear your seatbelt everyday! If you are not signed up on MyChart, please ask [place] how to sign up for it!   In a world where you can be anything, please be kind.   Body mass index is 27.81 kg/m.  A Healthy Lifestyle: Care Instructions Your Care Instructions  A healthy lifestyle can help you feel good, stay at a healthy weight, and have plenty of energy for both work and play. A healthy lifestyle is something you can share with your whole family. A healthy lifestyle also can lower your risk for serious health problems, such as high blood pressure, heart disease, and diabetes. You can follow a few steps listed below to improve your health and the health of your family. Follow-up care is a key part of your treatment and safety. Be sure to make and go to all appointments, and call your doctor if you are having problems. It's also a good idea to know your test results and keep a list of the medicines you take. How can you care for yourself at home? Do not eat too much sugar, fat, or fast foods. You can still have dessert and treats now and then. The goal is moderation. Start small to improve your eating habits. Pay attention to portion sizes, drink less juice and soda pop, and eat more fruits and vegetables. Eat a healthy amount of food. A 3-ounce serving of meat, for example, is about the size of a deck of cards. Fill the rest of your plate with vegetables and whole grains. Limit the amount of soda and sports drinks you have every day. Drink more water when you are thirsty. Eat at least 5 servings of fruits and vegetables every day. It may seem like a lot, but it is not hard to reach this goal. A serving or helping is 1 piece of fruit, 1 cup of vegetables, or 2 cups of leafy, raw vegetables. Have an apple or some carrot sticks as an afternoon snack instead of a candy bar. Try to have fruits and/or vegetables at every meal. Make exercise  part of your daily routine. You may want to start with simple activities, such as walking, bicycling, or slow swimming. Try to be active 30 to 60 minutes every day. You do not need to do all 30 to 60 minutes all at once. For example, you can exercise 3 times a day for 10 or 20 minutes. Moderate exercise is safe for most people, but it is always a good idea to talk to your doctor before starting an exercise program. Keep moving. Mow the lawn, work in the garden, or [employer]. Take the stairs instead of the elevator at work. If you smoke, quit. People who smoke have an increased risk for heart attack, stroke, cancer, and other lung illnesses. Quitting is hard, but there are ways to boost your chance of quitting tobacco for good. Use nicotine gum, patches, or lozenges. Ask your doctor about stop-smoking programs and medicines. Keep trying. In addition to reducing your risk of diseases in the future, you will notice some benefits soon after you stop using tobacco. If you have shortness of breath or asthma symptoms, they will likely get better within a few weeks after you quit. Limit how much alcohol you drink. Moderate amounts of alcohol (up to 2 drinks a day for men, 1 drink a day for women) are okay. But drinking too much can lead to  liver problems, high blood pressure, and other health problems. Family health If you have a family, there are many things you can do together to improve your health. Eat meals together as a family as often as possible. Eat healthy foods. This includes fruits, vegetables, lean meats and dairy, and whole grains. Include your family in your fitness plan. Most people think of activities such as jogging or tennis as the way to fitness, but there are many ways you and your family can be more active. Anything that makes you breathe hard and gets your heart pumping is exercise. Here are some tips: Walk to do errands or to take your child to school or the bus. Go for a family  bike ride after dinner instead of watching TV. Care instructions adapted under license by your healthcare professional. This care instruction is for use with your licensed healthcare professional. If you have questions about a medical condition or this instruction, always ask your healthcare professional. [place] any warranty or liability for your use of this information.

## 2021-05-26 NOTE — Progress Notes (Deleted)
Chief Complaint  Patient presents with   Contraception    Undecided between Nexplanon and BC pills   Audrey Cruz is a 26 y.o. G2P1011 No LMP recorded. Patient has had an injection. who is here for nexplanon insertion.  The current method of family planning is ***. She has no complaint today.  Pap is UTD. ***  Patient has *** received the Gardisil vaccine.  PMH, PSH, FH and SH were all reviewed and updated.   Medications and allergies were reviewed and updated.  BP 112/64   Ht 5\' 4"  (1.626 m)   Wt 162 lb (73.5 kg)   Breastfeeding No   BMI 27.81 kg/m  Procedures  Informed consent was obtained today for nexplanon insertion, with patient counseled in details re: indications, risks/benefits, and alternatives.  Specifically, patient is counseled re: the increased risk of irregular bleeding on Implanon.  Patient understands, all questions answered.    The left *** upper arm is used as location for nexplanon insertion.  The area is prepped with betadine.  Local anesthesia is administered using approximately 3 cc of 1% lidocaine.  A small stab incision was then made approximately 10 cm from the medial epicondyle 4 cm below the bicep groove. Nexplanon was sterilely inserted subcuticularly without difficulty. The implant was easily palpated. Hemostasis noted.  Incision was steri stripped and pressure dressing applied. Amenorrhea - Plan: POCT urine pregnancy  Plan:  Nexplanon placed successfully today. Patient tolerated procedure well without complication.  Patient to call if notice drainage from the incision, redness, or high fever; expect to have some light irregular which may last 2-3 months following the insertion. Follow up if bleeding is heavy or persistent.

## 2021-07-06 IMAGING — US US PELVIS COMPLETE WITH TRANSVAGINAL
1 series · 13 of 25 positions shown · non-contrast
Comparison: None

CLINICAL DATA: Heavy menstruation.  Recent abortion.

EXAM:
TRANSABDOMINAL AND TRANSVAGINAL ULTRASOUND OF PELVIS
TECHNIQUE: Both transabdominal and transvaginal ultrasound examinations of the
pelvis were performed. Transabdominal technique was performed for
global imaging of the pelvis including uterus, ovaries, adnexal
regions, and pelvic cul-de-sac. It was necessary to proceed with
endovaginal exam following the transabdominal exam to visualize the
endometrium and ovaries.

[Series 1: us pelvic complete with transvaginal · 159 acquisitions, 13 frames shown]
[im 1/159]
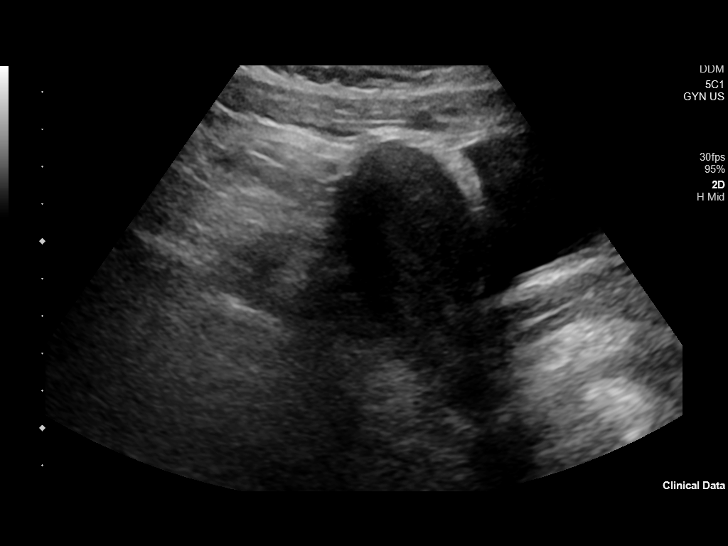
[im 14/159]
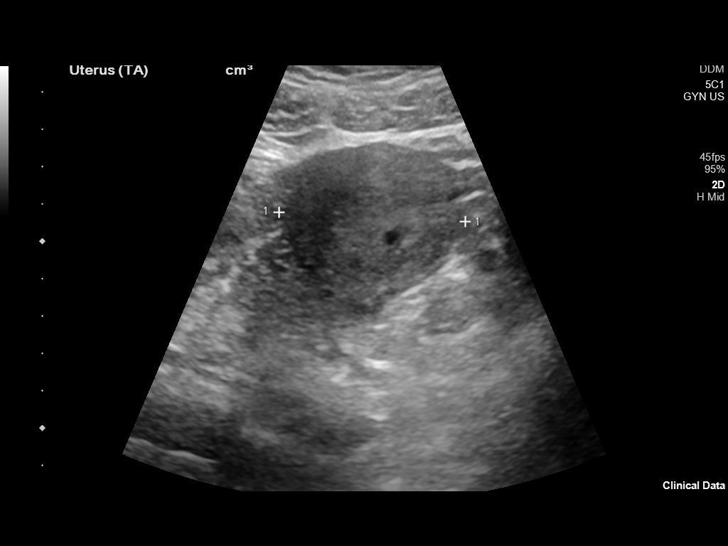
[im 27/159]
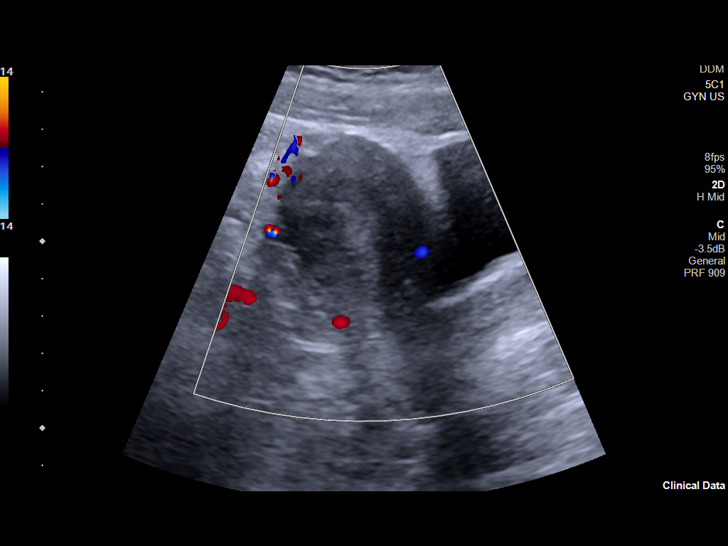
[im 40/159]
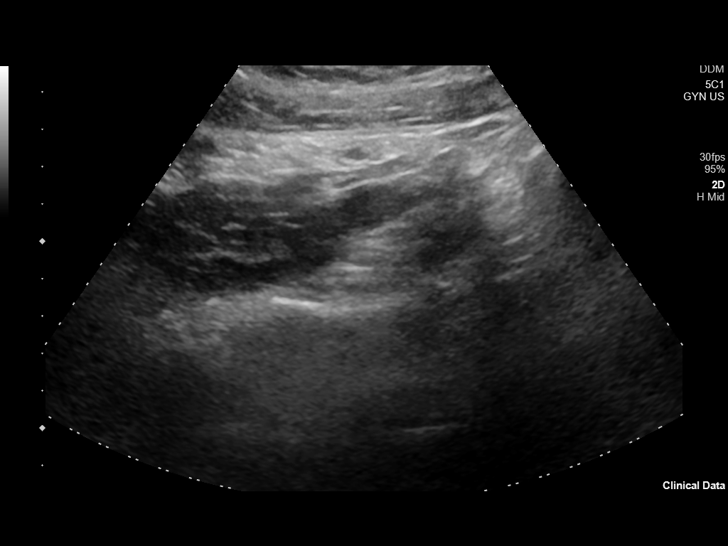
[im 53/159]
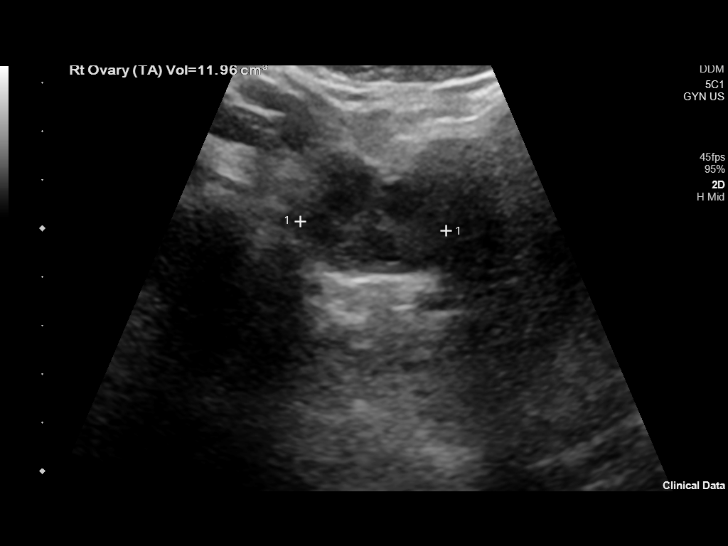
[im 66/159]
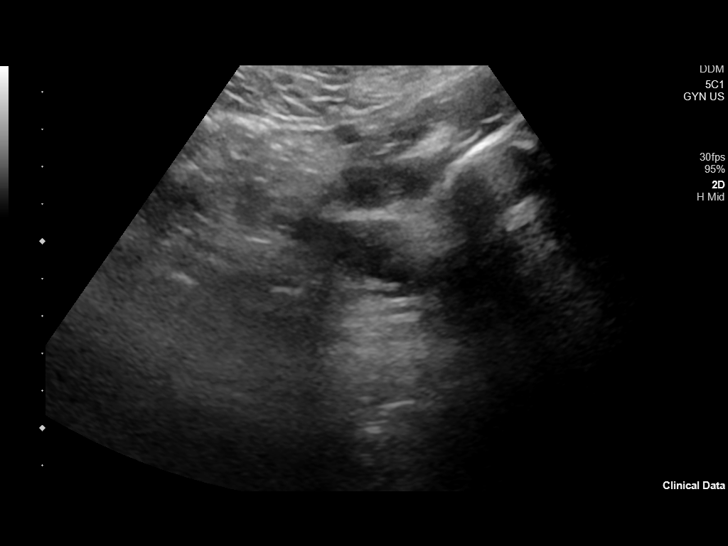
[im 80/159]
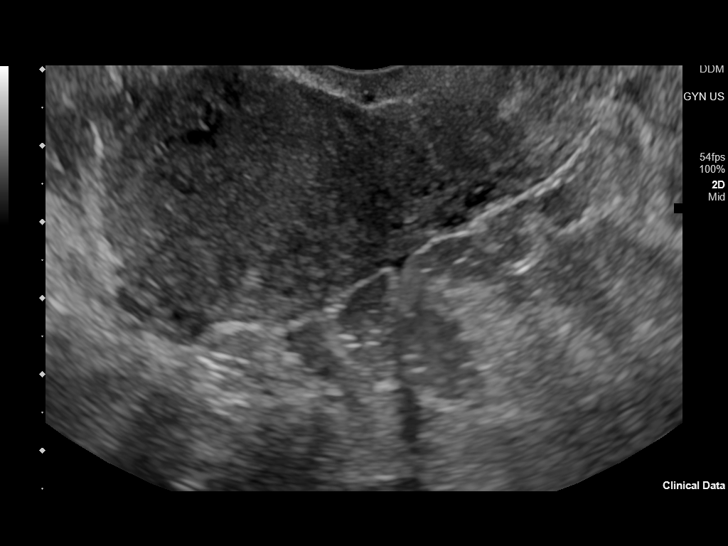
[im 93/159]
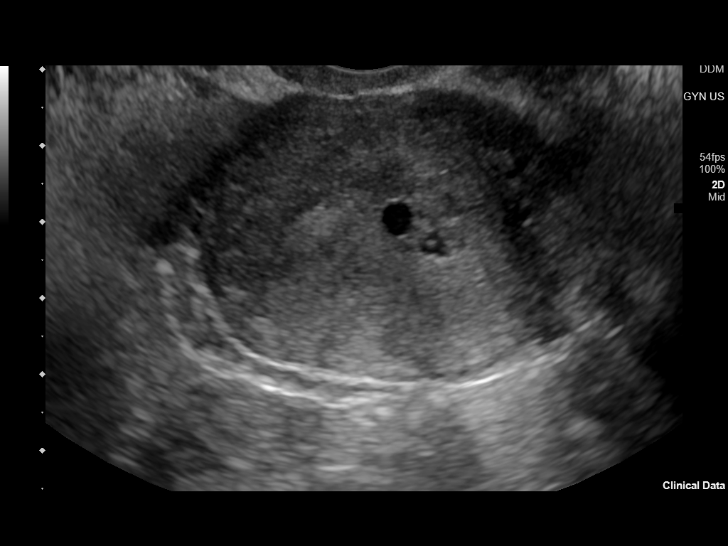
[im 106/159]
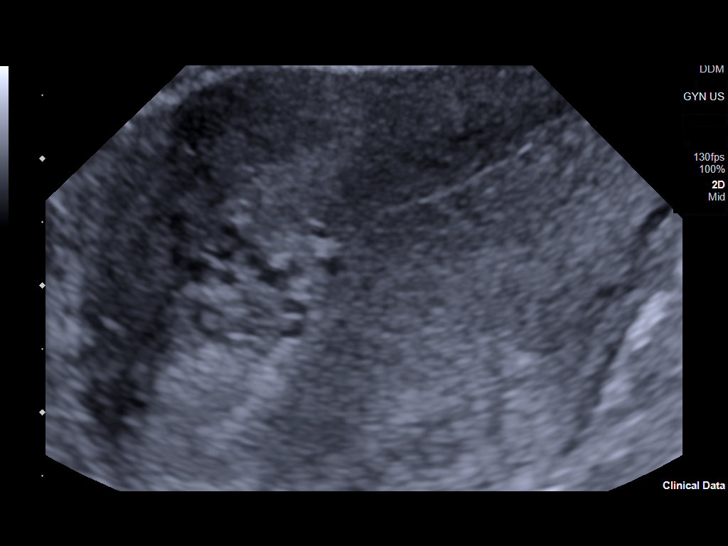
[im 119/159]
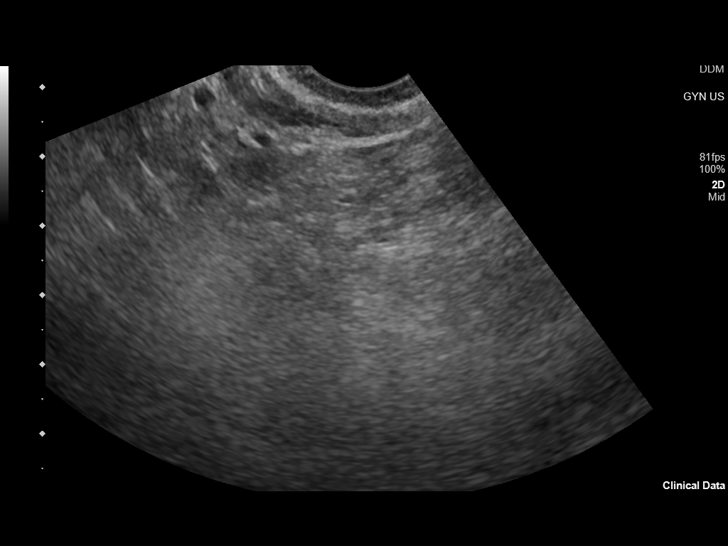
[im 132/159]
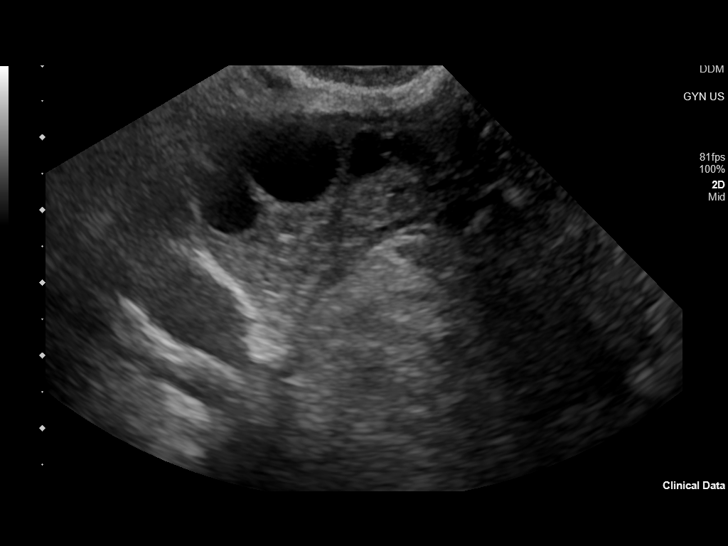
[im 145/159]
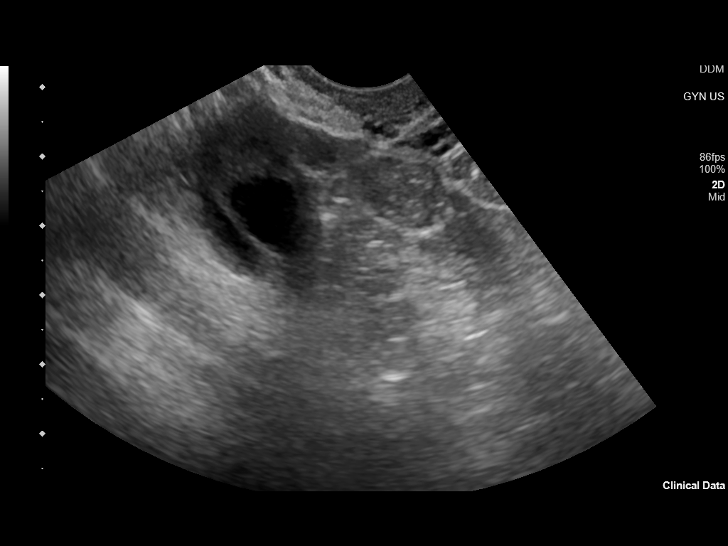
[im 159/159]
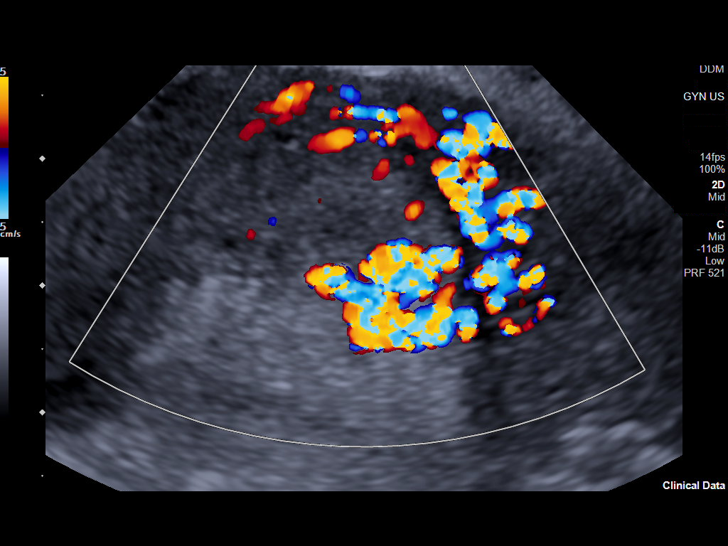

[13 of 25 positions shown; findings below may reference images not displayed]

FINDINGS: Uterus

Measurements: 7.8 x 4.0 x 5.4 cm = volume: 88 mL. No fibroids or
other mass visualized.

Endometrium

Thickness: 9 mm. Heterogeneity of the endometrium is identified
within the distal canal. Here, there is a intrauterine fluid
collection which measures 0.5 x 0.6 x 0.5 cm. No yolk sac or embryo
identified within this fluid collection multiple prominent vessels
with increased vascularity are identified localizing to the superior
left fundal region.

Right ovary

Measurements: 3.6 by 1.8 x 3.3 cm = volume: 11.7 mL. Normal
appearance/no adnexal mass.

Left ovary

Measurements: 3.1 x 1.6 x 2.2 cm = volume: 5.6 mL. Normal
appearance/no adnexal mass.

Other findings

No free fluid identified.
IMPRESSION: 1. There is a indeterminate focal fluid collection within the distal
canal measuring 6 mm in maximum diameter. No yolk sac or embryo
identified within this indeterminate structure. Correlation with
quantitative beta HCG is recommended.
2. Endometrial heterogeneity with increased vascularity is noted
within the distal canal extending into the superior left fundal
region. In a patient who is status post recent abortion retained
products of conception cannot be excluded. Alternatively, findings
may reflect underlying endometritis or focal lesion. If there are no
signs or symptoms of infection and there is a negative pregnancy
test then further evaluation with sonohysterogram may be helpful to
assess for etiology of abnormal uterine bleeding.

## 2021-08-22 ENCOUNTER — Other Ambulatory Visit: Payer: Self-pay

## 2021-08-22 NOTE — Telephone Encounter (Signed)
[facility] pharmacy sent refill request for [facility] 4mg . Patient was last seen in office of 05/26/21, according to Dr. 05/28/21 note patient was given samples of medication and advised to follow up in 3 months for contraception. Will deny refill request until patient schedules follow up. KW

## 2021-08-26 ENCOUNTER — Telehealth: Payer: Self-pay | Admitting: Obstetrics

## 2021-08-26 NOTE — Telephone Encounter (Signed)
These dates are available and what I left on her voice mail. September 5  at 10:55 or 3:35 with MMF

## 2021-08-26 NOTE — Telephone Encounter (Signed)
Called patient to schedule her 30month contraception f/u.Left patient vmail and asked to give the office a call so we could get her scheduled.

## 2021-08-30 MED ORDER — MEDROXYPROGESTERONE ACETATE 150 MG/ML IM SUSP
150.0000 mg | Freq: Once | INTRAMUSCULAR | Status: AC
Start: 2021-09-07 — End: 2021-09-07
  Administered 2021-09-07: 150 mg via INTRAMUSCULAR

## 2021-08-30 NOTE — Telephone Encounter (Signed)
I called spoke with patient about scheduling follow up. Patient prefers depo vs pill. Patient is scheduled for 9/6 for depo because patient stated she can't keep up with the pills. Please advise if patient need provider follow up visit?

## 2021-08-30 NOTE — Telephone Encounter (Signed)
Rx depo in chart for 09/07/21 appt. Annual due 11/23 so can f/u then.

## 2021-09-07 ENCOUNTER — Ambulatory Visit (INDEPENDENT_AMBULATORY_CARE_PROVIDER_SITE_OTHER): Payer: Medicaid Other

## 2021-09-07 VITALS — BP 100/64 | HR 87 | Ht 64.0 in | Wt 161.0 lb

## 2021-09-07 DIAGNOSIS — Z3042 Encounter for surveillance of injectable contraceptive: Secondary | ICD-10-CM | POA: Diagnosis not present

## 2021-09-07 LAB — POCT URINE PREGNANCY: Preg Test, Ur: NEGATIVE

## 2021-09-07 NOTE — Progress Notes (Signed)
Date last pap: 11/20/19. Last Depo-Provera: 02/16/21. Side Effects if any: None. Urine HCG: negative Serum HCG indicated? N/A. Depo-Provera 150 mg IM given by: Rocco Serene, LPN. Site: Right Upper Outer Quadrant. Next appointment due 11/22/21 - 12/06/21.

## 2021-11-30 ENCOUNTER — Ambulatory Visit: Payer: Medicaid Other

## 2021-11-30 DIAGNOSIS — Z3042 Encounter for surveillance of injectable contraceptive: Secondary | ICD-10-CM | POA: Insufficient documentation

## 2021-12-01 ENCOUNTER — Other Ambulatory Visit (HOSPITAL_COMMUNITY)
Admission: RE | Admit: 2021-12-01 | Discharge: 2021-12-01 | Disposition: A | Discharge status: Home / Self Care | Payer: Medicaid Other | Source: Ambulatory Visit | Attending: Licensed Practical Nurse | Admitting: Licensed Practical Nurse

## 2021-12-01 ENCOUNTER — Ambulatory Visit (INDEPENDENT_AMBULATORY_CARE_PROVIDER_SITE_OTHER): Payer: Medicaid Other | Admitting: Licensed Practical Nurse

## 2021-12-01 VITALS — BP 121/83 | HR 99 | Ht 64.0 in | Wt 162.3 lb

## 2021-12-01 DIAGNOSIS — Z01411 Encounter for gynecological examination (general) (routine) with abnormal findings: Secondary | ICD-10-CM | POA: Diagnosis not present

## 2021-12-01 DIAGNOSIS — Z01419 Encounter for gynecological examination (general) (routine) without abnormal findings: Secondary | ICD-10-CM

## 2021-12-01 DIAGNOSIS — N898 Other specified noninflammatory disorders of vagina: Secondary | ICD-10-CM

## 2021-12-01 NOTE — Progress Notes (Signed)
Gynecology Annual Exam  PCP: Mickie Bail, MD (Inactive)  Chief Complaint:  Chief Complaint  Patient presents with   Gynecologic Exam   Pt arrived with 5 mins left of appointment, agreed to short visit.  Here with her child asleep in the stroller   History of Present Illness: Patient is a 26 y.o. G2P1011 presents for annual exam. The patient notices she has some cramping and some light bleeding when she is due for her Depo. She sometimes has cramping and spotting when she would :be due" for a period. Reassured this is normal with Depo.   LMP: No LMP recorded. Patient has had an injection.   The patient is sexually active with 1 female partner. She currently uses Depo-Provera injections for contraception.Desires another pregnancy in a year or 2.  She denies dyspareunia.  The patient does perform self breast exams.  There is no notable family history of breast or ovarian cancer in her family.  The patient wears seatbelts: yes.  The patient has regular exercise: yes.  Goes to the gym once a week   The patient repots current symptoms of depression.  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   Respiratory: Negative.    Cardiovascular: Negative.   Gastrointestinal: Negative.   Genitourinary: Negative.   Musculoskeletal: Negative.   Neurological: Negative.   Psychiatric/Behavioral:  Positive for depression.   External itching: bought new legging off of the internet, noticed the itching started after wearing these, the redness and itching also was on her thighs, denies any changes ot her discharge.   Past Medical History:  Patient Active Problem List   Diagnosis Date Noted   Encounter for Depo-Provera contraception 11/30/2021   Postpartum care following  cesarean delivery 09/23/2020   Arrested labor    Amniotic fluid leaking 09/20/2020   Supervision of other normal pregnancy, antepartum 04/22/2020    Clinic [facility] Prenatal Labs  Dating 17 wk u/s Blood type: A/Positive/-- (07/18 1022)   Genetic Screen None Antibody:Negative (07/18 1022)  Anatomic [place] 5/17 complete/normal Rubella: 1.71 (07/18 1022)  Varicella: Immune  GTT Early: NA                Third trimester: 153. 3 hr: failed, however, unsubstantiated due to [facility] error- patient checking blood sugar at home RPR: Non Reactive (07/18 1022)   Rhogam n/a HBsAg: Negative (07/18 1022)  Hep C:  Vaccines TDAP:    decl                   Flu Shot:  no Covid:  no HIV: Non Reactive (07/18 1022)   Baby Food         Breast                       [id number]/-- (09/06 1456)  GC/CT  Contraception         Depo (vs Nexplanon) Pap:11/20/19  CBB  no   CS/VBAC no Late prenatal care  Support Person           Past Surgical History:  Past Surgical History:  Procedure Laterality Date   CESAREAN SECTION  09/21/2020   Procedure: CESAREAN SECTION;  Surgeon: Vena Austria, MD;  Location: [facility] ORS;  Service: Obstetrics;;   DILATION AND EVACUATION N/A 11/26/2019   Procedure: DILATATION AND EVACUATION;  Surgeon: Natale Milch, MD;  Location: [facility] ORS;  Service: Gynecology;  Laterality: N/A;   INDUCED ABORTION      Gynecologic History:  No LMP recorded. Patient has had an injection. Contraception: Depo-Provera injections Last Pap: Results were: 2021 no abnormalities   Obstetric History: G2P1011  Family History:  Family History  Problem Relation Age of Onset   Thyroid disease Mother    Hepatitis C Mother    Healthy Father     Social History:  Social History   Socioeconomic History   Marital status: Significant Other    Spouse name: Janyth Pupa   Number of children: Not on file   Years of education: Not on file   Highest education level: Not on file  Occupational History    Not on file  Tobacco Use   Smoking status: Never   Smokeless tobacco: Never  Vaping Use   Vaping Use: Former   Quit date: 03/07/2020  Substance and Sexual Activity   Alcohol use: No   Drug use: Not Currently    Types: Marijuana   Sexual activity: Yes    Birth control/protection: None, Condom  Other Topics Concern   Not on file  Social History Narrative   Not on file   Social Determinants of Health   Financial Resource Strain: Not on file  Food Insecurity: Not on file  Transportation Needs: Not on file  Physical Activity: Not on file  Stress: Not on file  Social Connections: Not on file  Intimate Partner Violence: Not on file    Allergies:  Allergies  Allergen Reactions   Amoxicillin Hives   Penicillins Hives    Medications: Prior to Admission medications   Not on File    Physical Exam Vitals: Height 5\' 4"  (1.626 m), weight 162 lb 4.8 oz (73.6 kg), not currently breastfeeding.  General: NAD HEENT: normocephalic, anicteric Thyroid: no enlargement, no palpable nodules Pulmonary: No increased work of breathing, CTAB Cardiovascular: RRR, distal pulses 2+ Breast: Breast symmetrical, no tenderness, no palpable nodules or masses, no skin or nipple retraction present, no nipple discharge.  No axillary or supraclavicular lymphadenopathy. Abdomen: NABS, soft, non-tender, non-distended.  Umbilicus without lesions.  No hepatomegaly, splenomegaly or masses palpable. No evidence of hernia  Genitourinary:  External: Normal external female genitalia with excoriations and redness.  Normal urethral meatus, normal Bartholin's and Skene's glands.    Vagina: Normal vaginal mucosa, no evidence of prolapse.    Cervix: Grossly normal in appearance, no bleeding  Uterus: Non-enlarged, mobile, normal contour.  No CMT  Adnexa: ovaries non-enlarged, no adnexal masses  Rectal: deferred  Lymphatic: no evidence of inguinal lymphadenopathy Extremities: no edema, erythema, or  tenderness Neurologic: Grossly intact Psychiatric: mood appropriate, affect full    Assessment: 26 y.o. G2P1011 routine annual exam  Plan: Problem List Items Addressed This Visit   None Visit Diagnoses     Itching in the vaginal area    -  Primary   Relevant Orders   Cervicovaginal ancillary only       1) 4) Gardasil Series discussed and if applicable offered to patient - Patient has previously completed 3 shot series   2) STI screening  wasoffered and declined  3)  ASCCP guidelines and rational discussed.  Patient opts for every 3 years screening interval  4) Contraception - the patient is currently using  Depo-Provera injections.  She is happy  with her current form of contraception and plans to continue We discussed safe sex practices to reduce her furture risk of STI's.    5) No follow-ups on file.   Carie Caddy, CNM  [facility], [facility] 12/02/2021, 7:56 PM

## 2021-12-05 ENCOUNTER — Ambulatory Visit: Payer: Medicaid Other

## 2021-12-05 LAB — CERVICOVAGINAL ANCILLARY ONLY
Candida Glabrata: NEGATIVE
Candida Vaginitis: NEGATIVE
Comment: NEGATIVE
Comment: NEGATIVE

## 2022-01-05 ENCOUNTER — Ambulatory Visit (INDEPENDENT_AMBULATORY_CARE_PROVIDER_SITE_OTHER): Payer: Medicaid Other

## 2022-01-05 DIAGNOSIS — Z3202 Encounter for pregnancy test, result negative: Secondary | ICD-10-CM

## 2022-01-05 DIAGNOSIS — Z3042 Encounter for surveillance of injectable contraceptive: Secondary | ICD-10-CM

## 2022-01-05 LAB — POCT URINE PREGNANCY: Preg Test, Ur: NEGATIVE

## 2022-01-05 IMAGING — US US OB COMP +14 WK
1 series · 14 of 28 positions shown · non-contrast
Comparison: none

CLINICAL DATA: Pregnancy.  Fetal anatomy evaluation.

EXAM:
OBSTETRICAL ULTRASOUND >14 WKS

[Series 1: us ob comp +14 wk · 0.25mm/px · 14 of 78 slices shown]
[im 3/78]
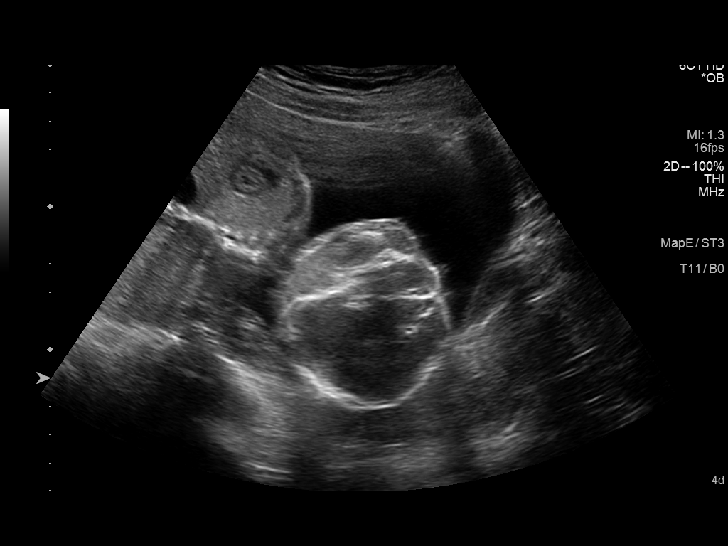
[im 9/78]
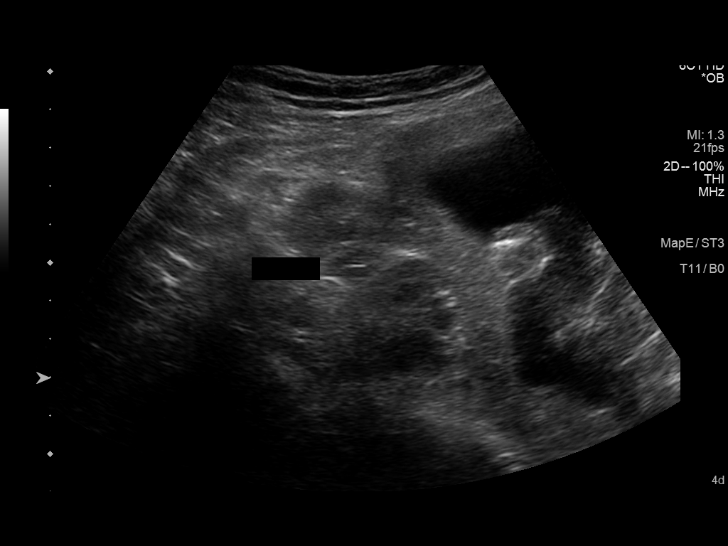
[im 15/78]
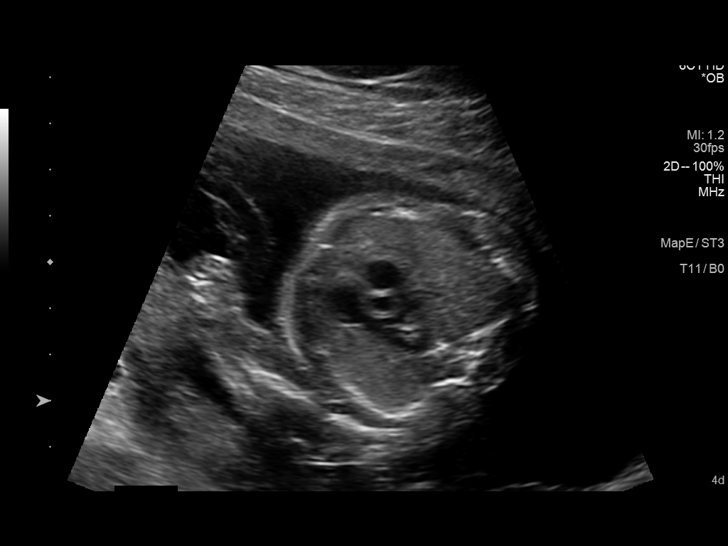
[im 20/78]
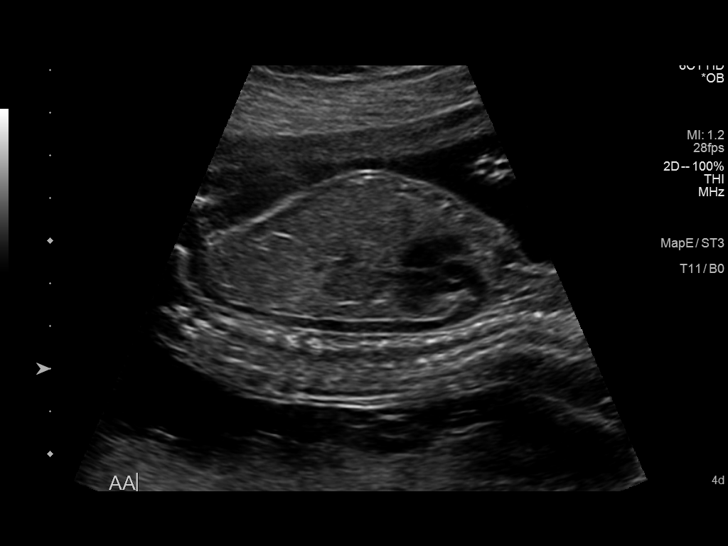
[im 26/78]
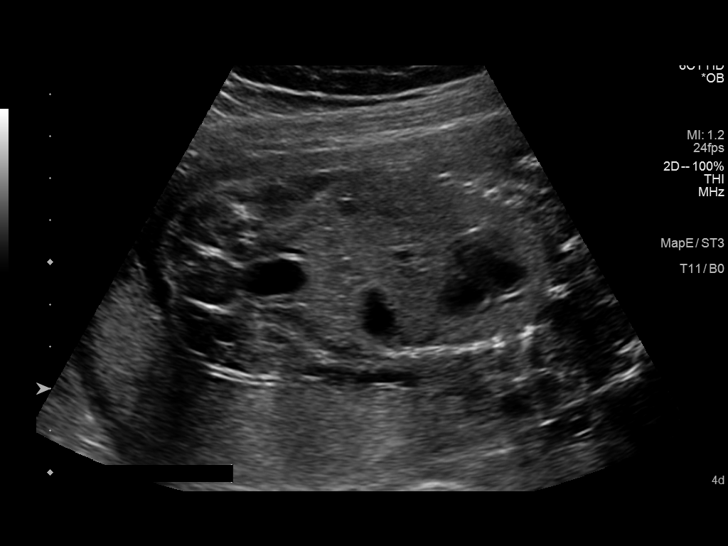
[im 32/78]
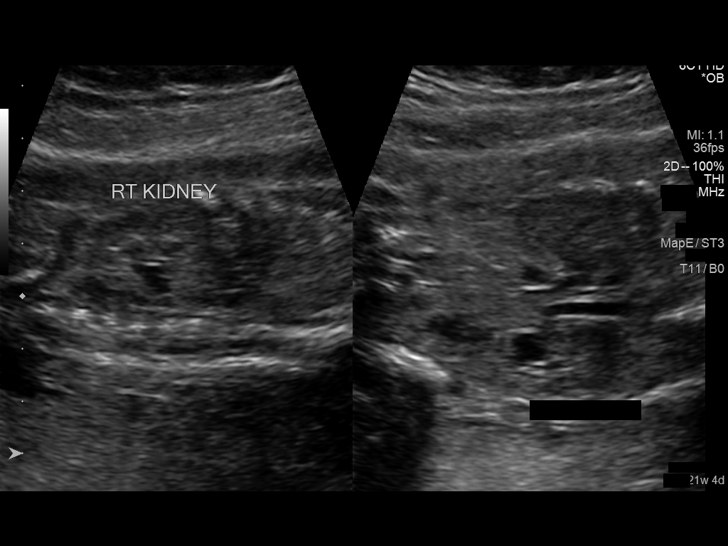
[im 38/78]
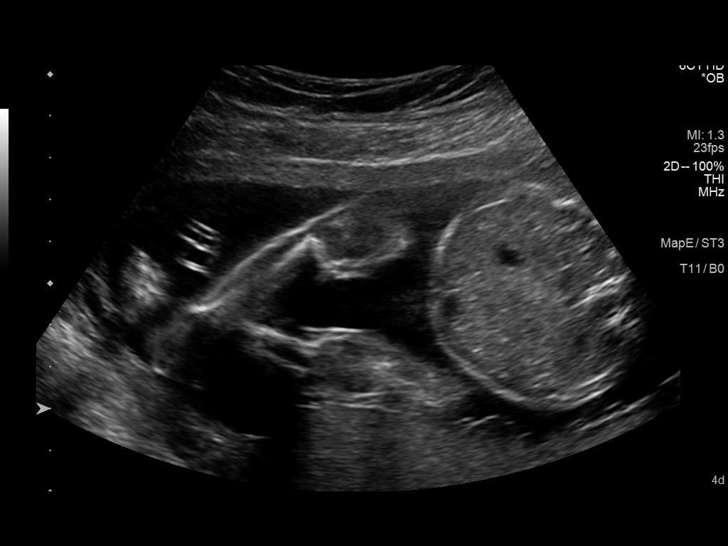
[im 43/78]
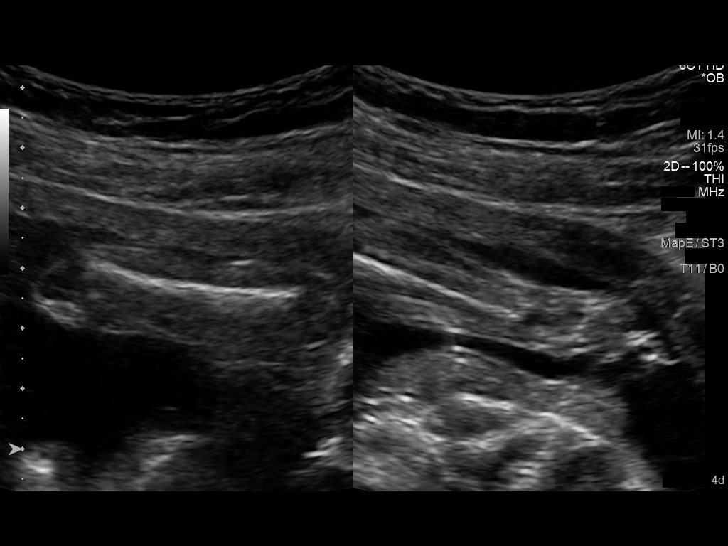
[im 49/78]
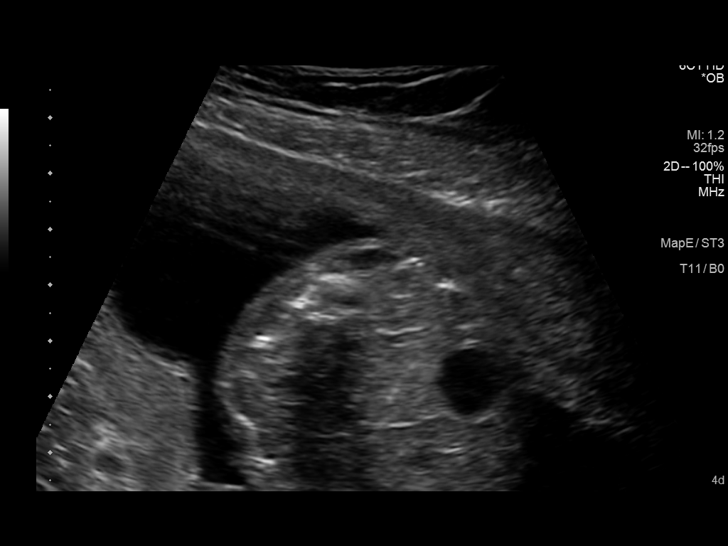
[im 55/78]
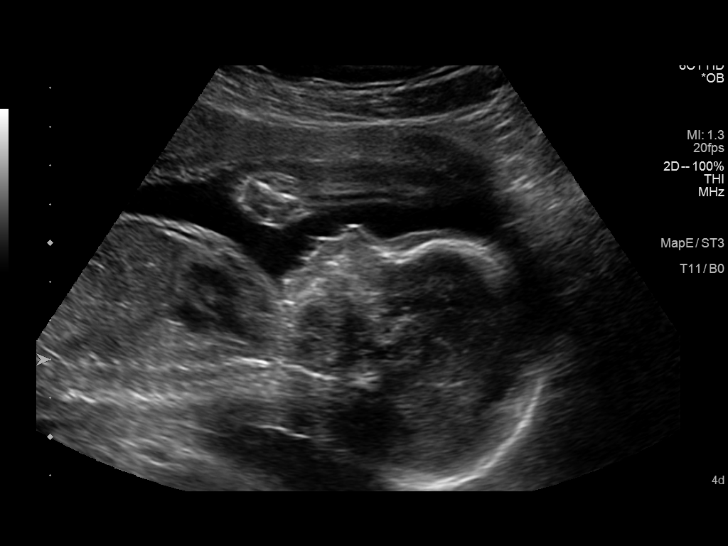
[im 60/78]
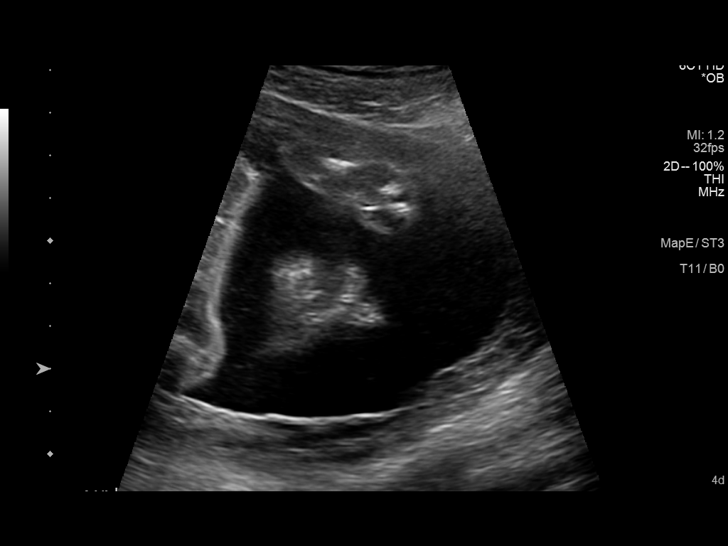
[im 66/78]
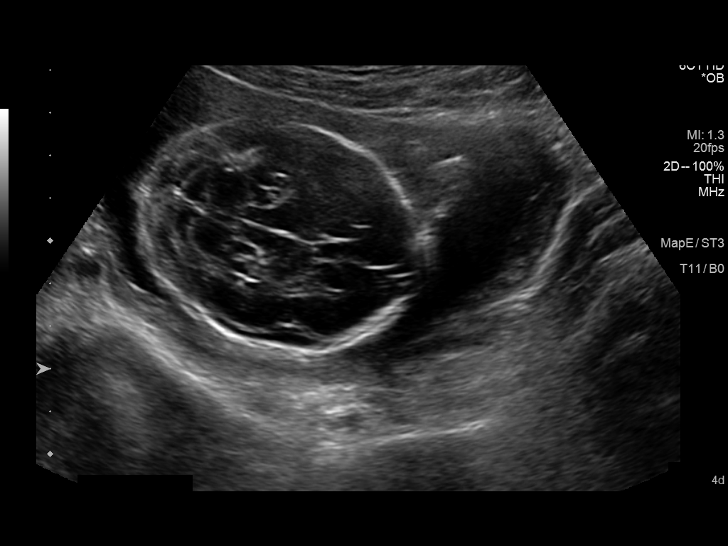
[im 72/78]
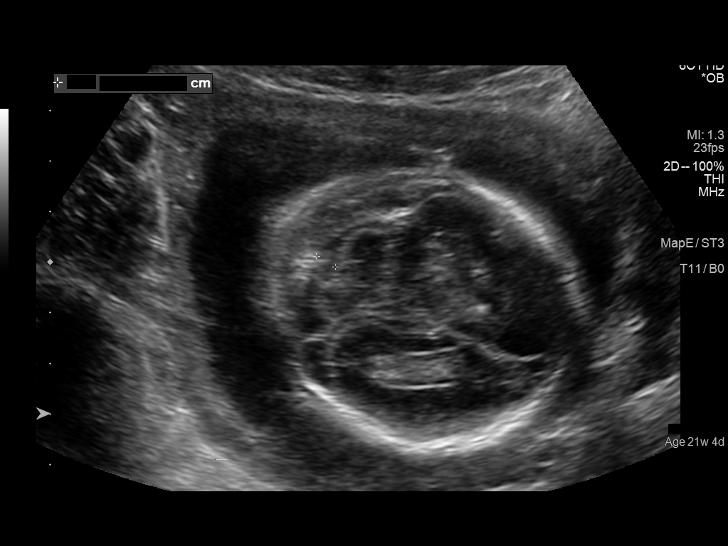
[im 78/78]
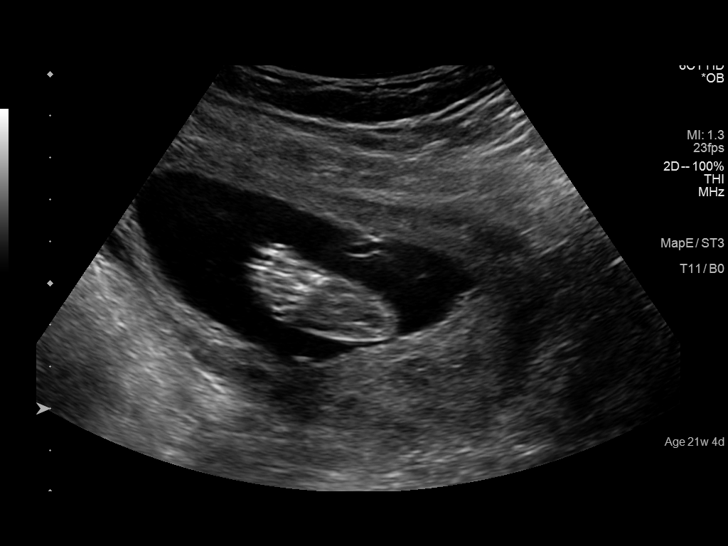

[14 of 28 positions shown; findings below may reference images not displayed]

FINDINGS: Number of Fetuses: 1

Heart Rate:  137 bpm

Movement: Present

Presentation: Cephalic

Previa: No

Placental Location: Posterior

Amniotic Fluid (Subjective): Normal

FETAL BIOMETRY

BPD: 5.3cm 22w 0d

HC:   19.3cm 21w 4d

AC:   16.9cm 21w 6d

FL:   3.7cm 21w 6d

Current Mean GA: 21w 6d US EDC: 09/22/2020

Assigned GA:  21w 4d Assigned EDC: 09/24/2020

FETAL ANATOMY

Lateral Ventricles: Appears normal

Thalami/CSP: Appears normal

Posterior Fossa:  Appears normal

Nuchal Region: Appears normal NFT= 3.2 mm

Upper Lip: Appears normal

Spine: Appears normal

4 Chamber Heart on Left: Appears normal

LVOT: Appears normal

RVOT: Appears normal

Stomach on Left: Appears normal

3 Vessel Cord: Appears normal

Cord Insertion site: Appears normal

Kidneys: Appears normal

Bladder: Appears normal

Extremities: Appears normal

Maternal Findings:

Cervix:  3.3 cm
IMPRESSION: Single viable intrauterine pregnancy at 21 weeks 6 days. Cephalic
presentation. No focal fetal abnormality is identified.

## 2022-01-05 MED ORDER — MEDROXYPROGESTERONE ACETATE 150 MG/ML IM SUSP: 150.0000 mg | Freq: Once | INTRAMUSCULAR | Status: DC

## 2022-01-05 MED ORDER — MEDROXYPROGESTERONE ACETATE 150 MG/ML IM SUSP
150.0000 mg | Freq: Once | INTRAMUSCULAR | Status: AC
  Administered 2022-01-05: 150 mg via INTRAMUSCULAR

## 2022-01-05 NOTE — Progress Notes (Addendum)
  Last Depo-Provera: 09/2021. Side Effects if any: no. Serum HCG indicated? No UPT done - negative . Depo-Provera 150 mg IM given by: Alwyn Pea, [place]. Next appointment due 3 months  I agree with above visit Roberto Scales, [facility]  01/31/22  10:57 AM

## 2022-03-30 ENCOUNTER — Ambulatory Visit (INDEPENDENT_AMBULATORY_CARE_PROVIDER_SITE_OTHER): Payer: Medicaid Other

## 2022-03-30 VITALS — Wt 160.7 lb

## 2022-03-30 DIAGNOSIS — Z3042 Encounter for surveillance of injectable contraceptive: Secondary | ICD-10-CM

## 2022-03-30 MED ORDER — MEDROXYPROGESTERONE ACETATE 150 MG/ML IM SUSP
150.0000 mg | Freq: Once | INTRAMUSCULAR | Status: AC
  Administered 2022-03-30: 150 mg via INTRAMUSCULAR

## 2022-03-30 NOTE — Progress Notes (Signed)
    NURSE VISIT NOTE  Subjective:    Patient ID: Audrey Cruz, female    DOB: 06/16/1995, 27 y.o.   MRN: [id number]  HPI  Patient is a 27 y.o. G38P1011 female who presents for depo provera injection.   Objective:    There were no vitals taken for this visit.  Last Annual: 12/01/2021. Last pap: 11/20/2019. Last Depo-Provera: 01/05/2022. Side Effects if any: none. Serum HCG indicated? No . Depo-Provera 150 mg IM given by: Jennings Books, CMA. Site: Left Ventrogluteal  Lab Review   Assessment:   1. Encounter for surveillance of injectable contraceptive      Plan:   Next appointment due between 6/13 and 06/29/22.    Minette Headland, CMA

## 2022-03-30 NOTE — Patient Instructions (Signed)

## 2022-04-28 IMAGING — US US OB FOLLOW-UP
1 series · 15 of 28 positions shown · non-contrast
Comparison: none

CLINICAL DATA: Abnormal glucose tolerance test. Third trimester
pregnancy. Evaluate fetal growth and amniotic fluid.

EXAM:
OBSTETRIC 14+ WK ULTRASOUND FOLLOW-UP

[Series 1: us ob follow up · 15 of 39 slices shown]
[im 1/39]
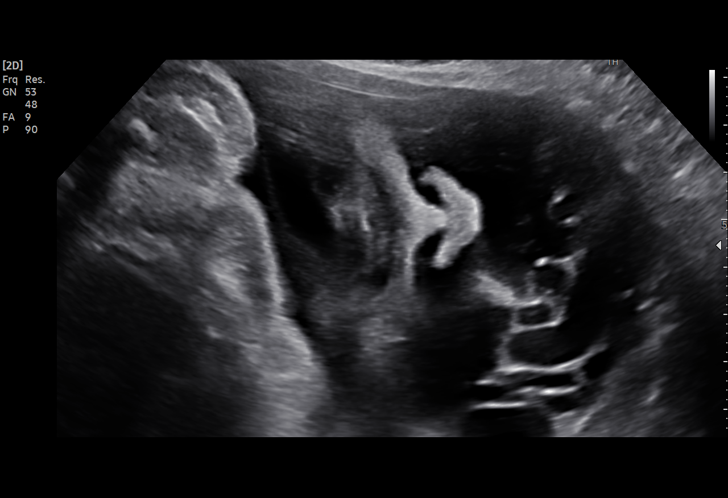
[im 3/39]
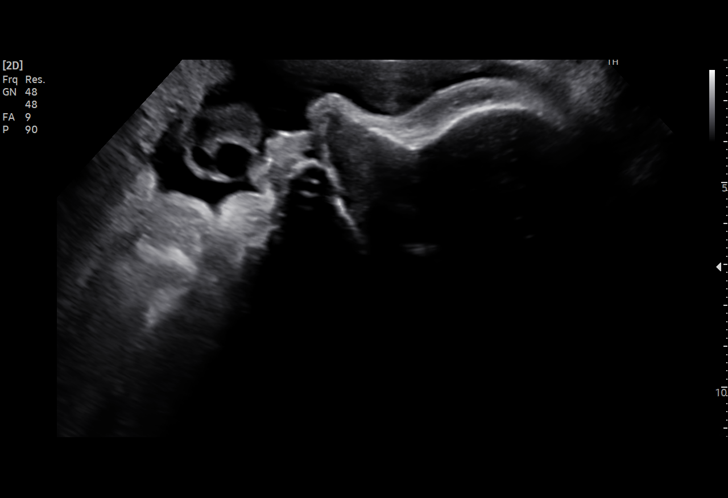
[im 6/39]
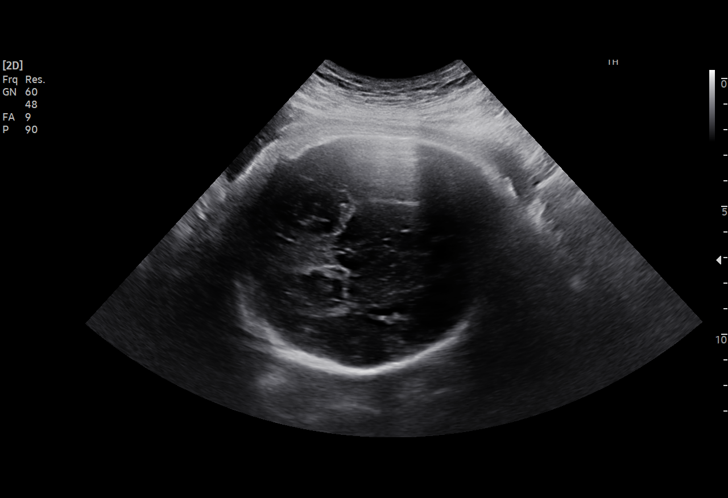
[im 9/39]
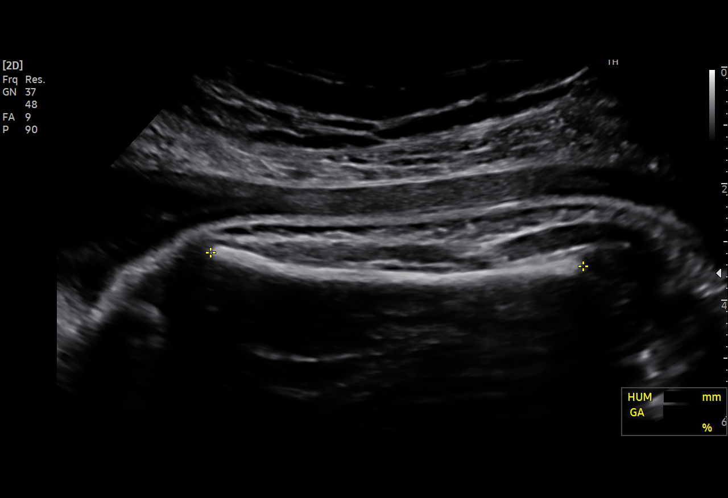
[im 12/39]
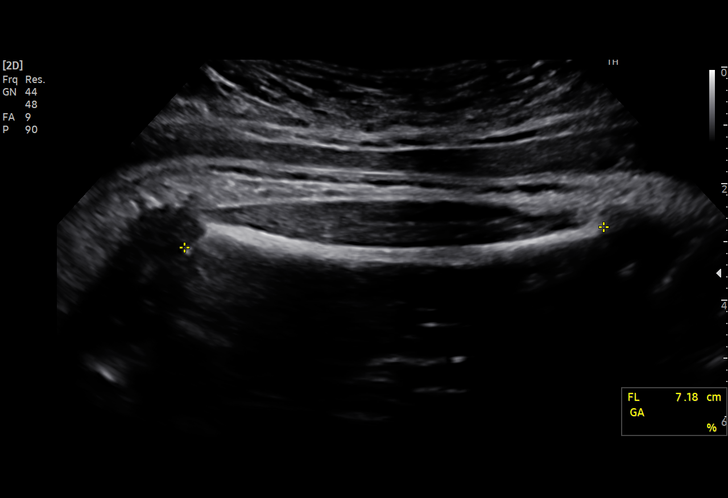
[im 15/39]
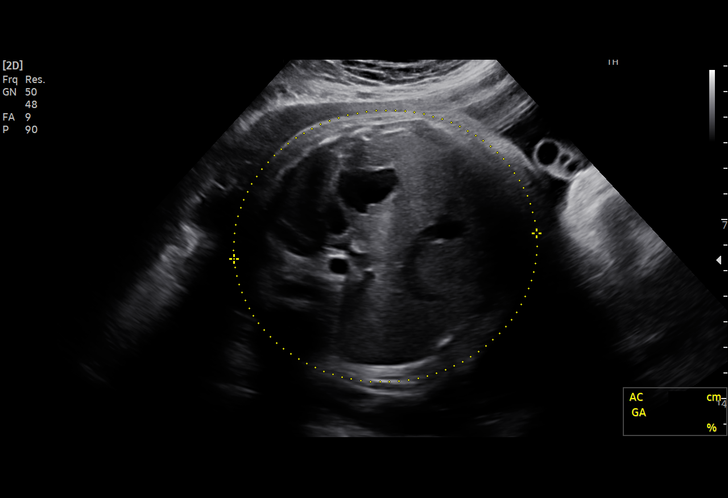
[im 17/39]
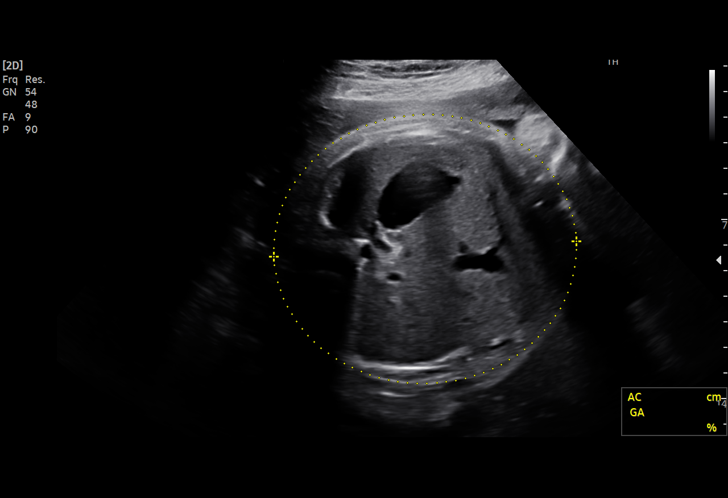
[im 20/39]
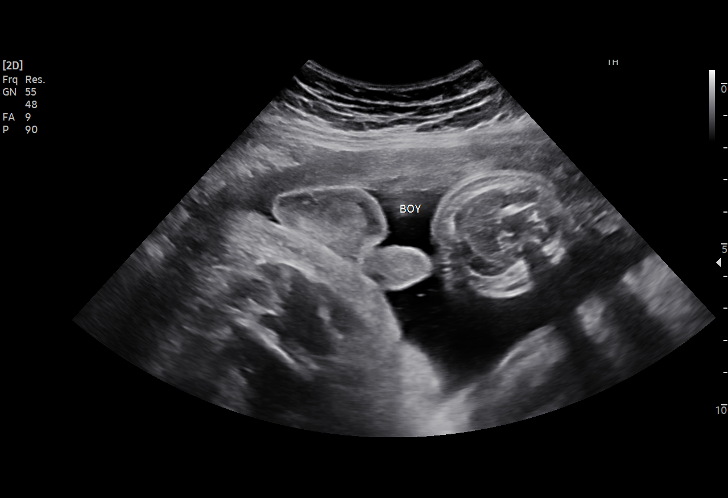
[im 22/39]
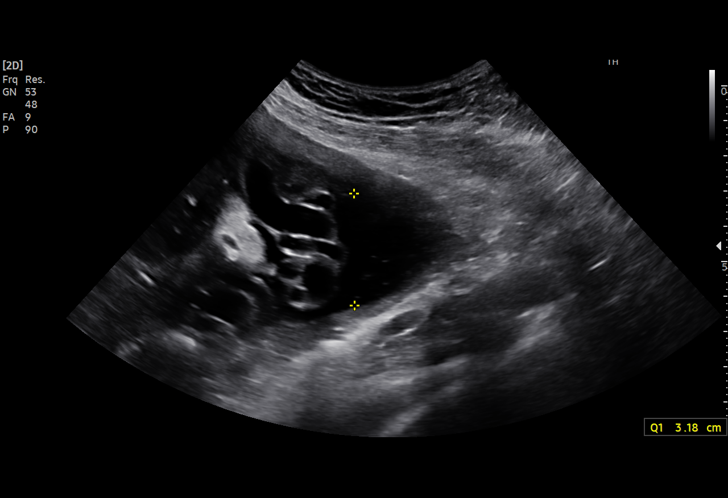
[im 24/39]
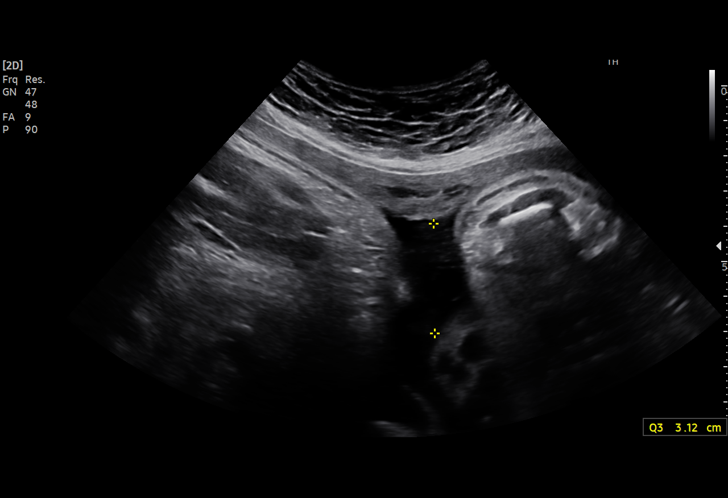
[im 27/39]
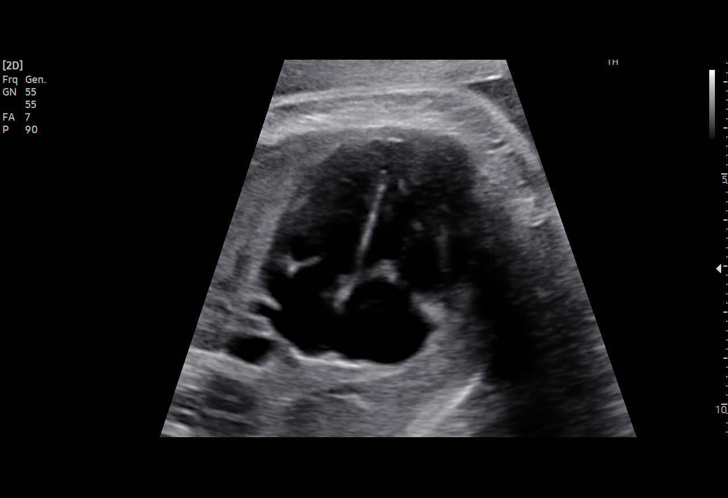
[im 30/39]
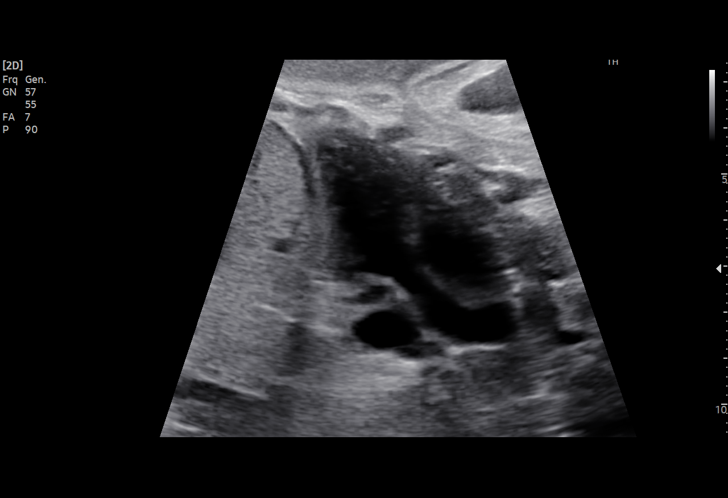
[im 33/39]
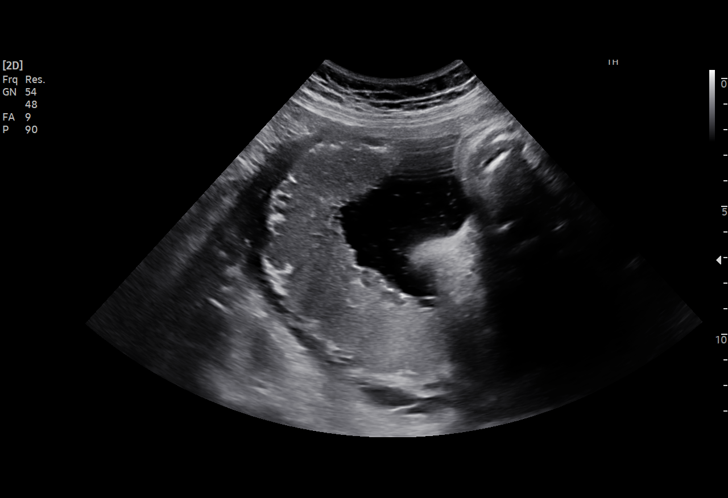
[im 36/39]
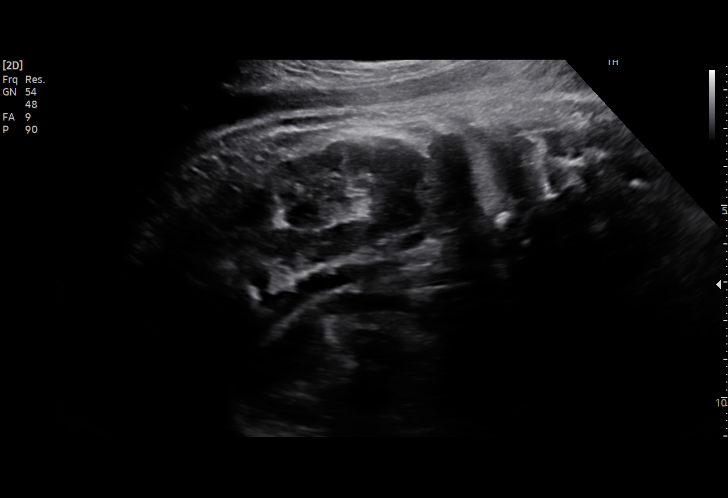
[im 39/39]
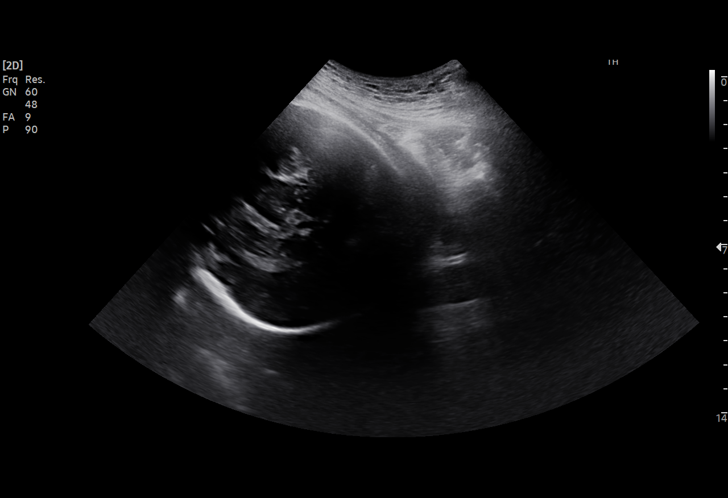

[15 of 28 positions shown; findings below may reference images not displayed]

FINDINGS: Number of Fetuses: 1

Heart Rate:  138 bpm

Movement: Yes

Presentation: Cephalic

Previa: No

Placental Location: Posterior

Amniotic Fluid (Subjective): Within normal limits

Amniotic Fluid (Objective):

AFI 16.1 cm (5%ile= 7.5 cm, 95%= 24.4 cm for 37 wks)

FETAL BIOMETRY

BPD:  9.1cm 37w 0d

HC:    33.5cm 38w 2d

AC:    35.2cm 39w 1d

FL:    7.2cm 36w 5d

Current Mean GA: 37w 5d US EDC: 09/24/2020

Assigned GA: 37w 5d Assigned EDC: 09/24/2020

Estimated Fetal Weight:  3,422g 83%ile

FETAL ANATOMY

Lateral Ventricles: Appears normal

Thalami/CSP: Appears normal

Posterior Fossa: Previously seen

Nuchal Region: Previously seen

Upper Lip: Appears normal

Spine: Previously seen

4 Chamber Heart on Left: Appears normal

LVOT: Previously seen

RVOT: Previously seen

Stomach on Left: Appears normal

3 Vessel Cord: Appears normal

Cord Insertion site: Previously seen

Kidneys: Appears normal

Bladder: Appears normal

Extremities: Previously seen

Sex: Male

Technical Limitations: Advanced gestational age

Maternal Findings:

Cervix:  Not evaluated (>34 wks)
IMPRESSION: Assigned GA currently 37 weeks 5 days. Appropriate fetal growth,
with EFW currently at 83 %ile.

Amniotic fluid volume within normal limits, with AFI of 16.1 cm.

## 2022-06-21 NOTE — Progress Notes (Signed)
    NURSE VISIT NOTE  Subjective:    Patient ID: Audrey Cruz, female    DOB: [birth year], 27 y.o.   MRN: [id number]  HPI  Patient is a 27 y.o. G21P1011 female who presents for depo provera injection.   Objective:    There were no vitals taken for this visit.  Last Annual: 12/01/21. Last pap: 11/20/19. Last Depo-Provera: 03/30/22. Side Effects if any: none. Serum HCG indicated? No . Depo-Provera 150 mg IM given by: Beverely Pace, CMA. Site: {AOB INJ [id number]  Assessment:   No diagnosis found.   Plan:   Next appointment due between SEPT 5-19    Loney Laurence, CMA

## 2022-06-22 ENCOUNTER — Ambulatory Visit (INDEPENDENT_AMBULATORY_CARE_PROVIDER_SITE_OTHER): Payer: Medicaid Other

## 2022-06-22 VITALS — BP 105/71 | HR 96 | Ht 64.0 in | Wt 173.0 lb

## 2022-06-22 DIAGNOSIS — Z3042 Encounter for surveillance of injectable contraceptive: Secondary | ICD-10-CM

## 2022-06-22 MED ORDER — MEDROXYPROGESTERONE ACETATE 150 MG/ML IM SUSY
150.0000 mg | PREFILLED_SYRINGE | Freq: Once | INTRAMUSCULAR | Status: AC
  Administered 2022-06-22: 150 mg via INTRAMUSCULAR

## 2022-07-03 ENCOUNTER — Encounter: Payer: Self-pay | Admitting: Licensed Practical Nurse

## 2022-07-19 ENCOUNTER — Other Ambulatory Visit: Payer: Self-pay

## 2022-07-19 DIAGNOSIS — R61 Generalized hyperhidrosis: Secondary | ICD-10-CM

## 2022-07-20 ENCOUNTER — Other Ambulatory Visit: Payer: Medicaid Other

## 2022-07-21 LAB — TSH: TSH: 1.53 u[IU]/mL (ref 0.450–4.500)

## 2022-09-07 ENCOUNTER — Ambulatory Visit (INDEPENDENT_AMBULATORY_CARE_PROVIDER_SITE_OTHER): Payer: Medicaid Other

## 2022-09-07 VITALS — BP 110/80 | HR 92 | Ht 64.0 in | Wt 173.0 lb

## 2022-09-07 DIAGNOSIS — Z3042 Encounter for surveillance of injectable contraceptive: Secondary | ICD-10-CM | POA: Diagnosis not present

## 2022-09-07 MED ORDER — MEDROXYPROGESTERONE ACETATE 150 MG/ML IM SUSY
150.0000 mg | PREFILLED_SYRINGE | Freq: Once | INTRAMUSCULAR | Status: AC
  Administered 2022-09-07: 150 mg via INTRAMUSCULAR

## 2022-09-07 NOTE — Progress Notes (Signed)
    NURSE VISIT NOTE  Subjective:    Patient ID: Audrey Cruz, female    DOB: Oct 30, 1995, 27 y.o.   MRN: [id number]  HPI  Patient is a 27 y.o. G73P1011 female who presents for depo provera injection.   Objective:    BP 110/80   Pulse 92   Ht 5\' 4"  (1.626 m)   Wt 173 lb (78.5 kg)   LMP  (LMP Unknown)   Breastfeeding No   BMI 29.70 kg/m   Last Annual: 12/01/21. Marland Kitchen Last pap: 11/20/19 . Last Depo-Provera: 06/22/2022. Side Effects if any: none. Serum HCG indicated? No . Depo-Provera 150 mg IM given by: Beverely Pace, CMA. Site: Right Ventrogluteal    Assessment:   1. Encounter for Depo-Provera contraception      Plan:   Next appointment due between NOV 25 and DEC 9 .    Loney Laurence, CMA

## 2022-11-13 ENCOUNTER — Telehealth: Payer: Self-pay | Admitting: Licensed Practical Nurse

## 2022-11-13 NOTE — Telephone Encounter (Signed)
Left message for patient to call office back to schedule annual appt with LMD after 12/02/22

## 2022-12-04 ENCOUNTER — Ambulatory Visit: Payer: Medicaid Other

## 2022-12-04 VITALS — Ht 64.0 in

## 2022-12-04 DIAGNOSIS — Z3042 Encounter for surveillance of injectable contraceptive: Secondary | ICD-10-CM

## 2022-12-04 MED ORDER — MEDROXYPROGESTERONE ACETATE 150 MG/ML IM SUSP: 150.0000 mg | Freq: Once | INTRAMUSCULAR | Status: DC

## 2022-12-04 NOTE — Progress Notes (Signed)
Patient came to receive her Depo injection. She decided to wait and schedule an appointment to discuss other options of birth control at this time. She states she has been reading about a lawsuit involving depo and brain tumors. No injection was administered today.

## 2022-12-05 ENCOUNTER — Ambulatory Visit: Payer: Medicaid Other | Admitting: Advanced Practice Midwife

## 2022-12-05 ENCOUNTER — Encounter: Payer: Self-pay | Admitting: Advanced Practice Midwife

## 2022-12-05 VITALS — BP 117/75 | HR 90 | Ht 64.0 in | Wt 173.2 lb

## 2022-12-05 DIAGNOSIS — Z30011 Encounter for initial prescription of contraceptive pills: Secondary | ICD-10-CM | POA: Diagnosis not present

## 2022-12-05 DIAGNOSIS — Z3009 Encounter for other general counseling and advice on contraception: Secondary | ICD-10-CM | POA: Diagnosis not present

## 2022-12-05 MED ORDER — SLYND 4 MG PO TABS: 1.0000 | ORAL_TABLET | Freq: Every day | ORAL | 12 refills | Status: DC

## 2022-12-05 NOTE — Progress Notes (Signed)
Patient ID: Audrey Cruz, female   DOB: 1995/08/07, 27 y.o.   MRN: [id number]  Reason for Visit: birth control   Subjective:  HPI:  Audrey Cruz is a 27 y.o. female being seen to discuss concerns regarding Depo birth control and switching to pill birth control. She has been on Depo for the past 2 years. Prior to that she took Slynd (missed a couple doses and got pregnant). Prior to that she took Junel and did not like the side effect of feeling "hormonal". She has recently become aware of a connection between Depo and increased risk of Meningioma. She has had a couple of new symptoms recently- eye twitching and left side cranial headache.   A study published in March 2024 in the [employer] Journal reports an increased risk of intracranial Meningioma with long term use of Depo Provera.  Last dose of Depo was 3 months ago. She would like to take Slynd.   Past Medical History:  Diagnosis Date   Medical history non-contributory    Miscarriage    Family History  Problem Relation Age of Onset   Thyroid disease Mother    Hepatitis C Mother    Healthy Father    Past Surgical History:  Procedure Laterality Date   CESAREAN SECTION  09/21/2020   Procedure: CESAREAN SECTION;  Surgeon: Vena Austria, MD;  Location: [facility] ORS;  Service: Obstetrics;;   DILATION AND EVACUATION N/A 11/26/2019   Procedure: DILATATION AND EVACUATION;  Surgeon: Natale Milch, MD;  Location: [facility] ORS;  Service: Gynecology;  Laterality: N/A;   INDUCED ABORTION      Short Social History:  Social History   Tobacco Use   Smoking status: Never   Smokeless tobacco: Never  Substance Use Topics   Alcohol use: No    Allergies  Allergen Reactions   Amoxicillin Hives   Penicillins Hives    Current Outpatient Medications  Medication Sig Dispense Refill   Drospirenone (SLYND) 4 MG TABS Take 1 tablet (4 mg total) by mouth daily. 28 tablet 12   No current facility-administered medications for  this visit.    Review of Systems  Constitutional:  Negative for chills and fever.  HENT:  Negative for congestion, ear discharge, ear pain, hearing loss, sinus pain and sore throat.   Eyes:  Negative for blurred vision and double vision.       Positive for eye twitching  Respiratory:  Negative for cough, shortness of breath and wheezing.   Cardiovascular:  Negative for chest pain, palpitations and leg swelling.  Gastrointestinal:  Negative for abdominal pain, blood in stool, constipation, diarrhea, heartburn, melena, nausea and vomiting.  Genitourinary:  Negative for dysuria, flank pain, frequency, hematuria and urgency.  Musculoskeletal:  Negative for back pain, joint pain and myalgias.  Skin:  Negative for itching and rash.  Neurological:  Positive for headaches. Negative for dizziness, tingling, tremors, sensory change, speech change, focal weakness, seizures, loss of consciousness and weakness.  Endo/Heme/Allergies:  Negative for environmental allergies. Does not bruise/bleed easily.  Psychiatric/Behavioral:  Negative for depression, hallucinations, memory loss, substance abuse and suicidal ideas. The patient is not nervous/anxious and does not have insomnia.        Objective:  Objective   Vitals:   12/05/22 1105  BP: 117/75  Pulse: 90  Weight: 173 lb 3.2 oz (78.6 kg)  Height: 5\' 4"  (1.626 m)   Body mass index is 29.73 kg/m. Constitutional: Well nourished, well developed female in no acute distress.  HEENT: normal Skin: Warm and dry.  Extremity:  no edema   Respiratory:  Normal respiratory effort Psych: Alert and Oriented x3. No memory deficits. Normal mood and affect.    Assessment/Plan:     27 y.o. G2 P39 female contraception management  Discontinue Depo Provera Start POP Slynd Follow up as needed   Tresea Mall, CNM [facility] 12/05/2022 11:44 AM

## 2022-12-22 ENCOUNTER — Encounter: Payer: Self-pay | Admitting: Advanced Practice Midwife

## 2022-12-22 ENCOUNTER — Other Ambulatory Visit (HOSPITAL_COMMUNITY)
Admission: RE | Admit: 2022-12-22 | Discharge: 2022-12-22 | Disposition: A | Discharge status: Home / Self Care | Payer: Medicaid Other | Source: Ambulatory Visit | Attending: Advanced Practice Midwife | Admitting: Advanced Practice Midwife

## 2022-12-22 ENCOUNTER — Ambulatory Visit (INDEPENDENT_AMBULATORY_CARE_PROVIDER_SITE_OTHER): Payer: Medicaid Other | Admitting: Advanced Practice Midwife

## 2022-12-22 VITALS — BP 112/75 | HR 111 | Ht 64.0 in | Wt 176.0 lb

## 2022-12-22 DIAGNOSIS — Z01419 Encounter for gynecological examination (general) (routine) without abnormal findings: Secondary | ICD-10-CM | POA: Insufficient documentation

## 2022-12-22 DIAGNOSIS — Z124 Encounter for screening for malignant neoplasm of cervix: Secondary | ICD-10-CM | POA: Insufficient documentation

## 2022-12-22 DIAGNOSIS — Z131 Encounter for screening for diabetes mellitus: Secondary | ICD-10-CM

## 2022-12-22 DIAGNOSIS — Z13220 Encounter for screening for lipoid disorders: Secondary | ICD-10-CM

## 2022-12-22 NOTE — Progress Notes (Signed)
[facility]  Gynecology Annual Exam   PCP: Mickie Bail, MD (Inactive)  Chief Complaint:  Chief Complaint  Patient presents with   Annual Exam    History of Present Illness: Patient is a 27 y.o. G2P1011 presents for annual exam. The patient has complaint today of some mood adjusting since switching from Depo to [facility]. She remembers a similar occurrence when starting Depo so she plans to continue with [facility] for now.   LMP: Patient's last menstrual period was 12/03/2022. Average Interval: regular, 28 days Duration of flow: 2 days Heavy Menses: no Clots: no Intermenstrual Bleeding: no Postcoital Bleeding: no Dysmenorrhea: no  The patient is sexually active. She currently uses OCP (estrogen/progesterone) for contraception. She denies dyspareunia.  The patient does perform self breast exams.  There is notable family history of breast or ovarian cancer in her family. Her maternal grandmother was diagnosed with ovarian cancer and a paternal aunt was diagnosed with breast cancer at age 22. She accepts [facility] testing today.  The patient wears seatbelts: yes.   The patient has regular exercise:  she is active with her child and walking her dog. She reports diet is improving, hydration is adequate, she usually has 5-6 hours of sleep .    The patient denies current symptoms of depression.    Review of Systems: Review of Systems  Constitutional:  Positive for malaise/fatigue. Negative for chills and fever.       Positive for hot/cold intolerance  HENT:  Negative for congestion, ear discharge, ear pain, hearing loss, sinus pain and sore throat.   Eyes:  Negative for blurred vision and double vision.  Respiratory:  Negative for cough, shortness of breath and wheezing.   Cardiovascular:  Negative for chest pain, palpitations and leg swelling.  Gastrointestinal:  Negative for abdominal pain, blood in stool, constipation, diarrhea, heartburn, melena, nausea and vomiting.  Genitourinary:   Negative for dysuria, flank pain, frequency, hematuria and urgency.  Musculoskeletal:  Negative for back pain, joint pain and myalgias.  Skin:  Negative for itching and rash.  Neurological:  Positive for headaches. Negative for dizziness, tingling, tremors, sensory change, speech change, focal weakness, seizures, loss of consciousness and weakness.  Endo/Heme/Allergies:  Negative for environmental allergies. Does not bruise/bleed easily.  Psychiatric/Behavioral:  Positive for depression. Negative for hallucinations, memory loss, substance abuse and suicidal ideas. The patient is not nervous/anxious and does not have insomnia.     Past Medical History:  Patient Active Problem List   Diagnosis Date Noted Date Diagnosed   Encounter for Depo-Provera contraception 11/30/2021     Past Surgical History:  Past Surgical History:  Procedure Laterality Date   CESAREAN SECTION  09/21/2020   Procedure: CESAREAN SECTION;  Surgeon: Vena Austria, MD;  Location: [facility] ORS;  Service: Obstetrics;;   DILATION AND EVACUATION N/A 11/26/2019   Procedure: DILATATION AND EVACUATION;  Surgeon: Natale Milch, MD;  Location: [facility] ORS;  Service: Gynecology;  Laterality: N/A;   INDUCED ABORTION      Gynecologic History:  Patient's last menstrual period was 12/03/2022. Contraception: OCP (estrogen/progesterone) Last Pap: 2021 Results were: no abnormalities   Obstetric History: G2P1011  Family History:  Family History  Problem Relation Age of Onset   Thyroid disease Mother    Hepatitis C Mother    Healthy Father    Ovarian cancer Maternal Grandmother 75   Lung cancer Maternal Grandfather    Breast cancer Paternal Aunt 68       great aunt  Social History:  Social History   Socioeconomic History   Marital status: Significant Other    Spouse name: Janyth Pupa   Number of children: Not on file   Years of education: Not on file   Highest education level: Not on file  Occupational History    Not on file  Tobacco Use   Smoking status: Never   Smokeless tobacco: Never  Vaping Use   Vaping status: Former   Quit date: 03/07/2020  Substance and Sexual Activity   Alcohol use: No   Drug use: Not Currently    Types: Marijuana   Sexual activity: Yes    Birth control/protection: None, Condom  Other Topics Concern   Not on file  Social History Narrative   Not on file   Social Drivers of Health   Financial Resource Strain: Not on file  Food Insecurity: Not on file  Transportation Needs: Not on file  Physical Activity: Not on file  Stress: Not on file  Social Connections: Not on file  Intimate Partner Violence: Not on file    Allergies:  Allergies  Allergen Reactions   Amoxicillin Hives   Penicillins Hives    Medications: Prior to Admission medications   Medication Sig Start Date End Date Taking? Authorizing Provider  Drospirenone (SLYND) 4 MG TABS Take 1 tablet (4 mg total) by mouth daily. 12/05/22  Yes Tresea Mall, CNM  FLUoxetine (PROZAC) 10 MG capsule Take 10 mg by mouth daily. 11/27/22  Yes [provider]    Physical Exam Vitals: Blood pressure 112/75, pulse (!) 111, height 5\' 4"  (1.626 m), weight 176 lb (79.8 kg), last menstrual period 12/03/2022.  General: NAD HEENT: normocephalic, anicteric Thyroid: no enlargement, no palpable nodules Pulmonary: No increased work of breathing, CTAB Cardiovascular: RRR, distal pulses 2+ Breast: Breast symmetrical, no tenderness, no palpable nodules or masses, no skin or nipple retraction present, no nipple discharge.  No axillary or supraclavicular lymphadenopathy. Abdomen: NABS, soft, non-tender, non-distended.  Umbilicus without lesions.  No hepatomegaly, splenomegaly or masses palpable. No evidence of hernia  Genitourinary:  External: Normal external female genitalia.  Normal urethral meatus, normal Bartholin's and Skene's glands.    Vagina: Normal vaginal mucosa, no evidence of prolapse.    Cervix: Grossly  normal in appearance, no bleeding  Uterus: Non-enlarged, mobile, normal contour.  No CMT  Adnexa: ovaries non-enlarged, no adnexal masses  Rectal: deferred  Lymphatic: no evidence of inguinal lymphadenopathy Extremities: no edema, erythema, or tenderness Neurologic: Grossly intact Psychiatric: mood appropriate, affect full   Assessment: 27 y.o. G2P1011 routine annual exam  Plan: Problem List Items Addressed This Visit   None Visit Diagnoses       Well woman exam with routine gynecological exam    -  Primary   Relevant Orders   Hgb A1c w/o eAG   Lipid Panel With LDL/HDL Ratio   CBC with Differential/Platelet   Comprehensive metabolic panel   Cytology - PAP     Screening for cervical cancer       Relevant Orders   Cytology - PAP     Screening for diabetes mellitus       Relevant Orders   Hgb A1c w/o eAG     Screening, lipid       Relevant Orders   Lipid Panel With LDL/HDL Ratio       1) STI screening  was offered and declined  2)  ASCCP guidelines and rationale discussed.  Patient opts for every 3 years screening interval. PAP today  3) Contraception - the patient is currently using  OCP (estrogen/progesterone).  She is happy with her current form of contraception and plans to continue  4) Routine healthcare maintenance including cholesterol, diabetes screening discussed Ordered today  5) Increase healthy lifestyle; diet, sleep  6) Return in about 1 year (around 12/22/2023) for annual established gyn.   Tresea Mall, CNM [facility] 12/22/2022 5:10 PM

## 2022-12-23 LAB — COMPREHENSIVE METABOLIC PANEL
ALT: 12 [IU]/L (ref 0–32)
AST: 17 [IU]/L (ref 0–40)
Albumin: 4.4 g/dL (ref 4.0–5.0)
Alkaline Phosphatase: 87 [IU]/L (ref 44–121)
BUN/Creatinine Ratio: 18 (ref 9–23)
BUN: 11 mg/dL (ref 6–20)
Bilirubin Total: 0.3 mg/dL (ref 0.0–1.2)
CO2: 20 mmol/L (ref 20–29)
Calcium: 9.9 mg/dL (ref 8.7–10.2)
Chloride: 103 mmol/L (ref 96–106)
Creatinine, Ser: 0.62 mg/dL (ref 0.57–1.00)
Globulin, Total: 2.5 g/dL (ref 1.5–4.5)
Glucose: 99 mg/dL (ref 70–99)
Potassium: 4.1 mmol/L (ref 3.5–5.2)
Sodium: 139 mmol/L (ref 134–144)
Total Protein: 6.9 g/dL (ref 6.0–8.5)
eGFR: 127 mL/min/{1.73_m2} (ref >59)

## 2022-12-23 LAB — CBC WITH DIFFERENTIAL/PLATELET
Basophils Absolute: 0.1 10*3/uL (ref 0.0–0.2)
Basos: 1 %
EOS (ABSOLUTE): 0.1 10*3/uL (ref 0.0–0.4)
Eos: 1 %
Hematocrit: 39.9 % (ref 34.0–46.6)
Hemoglobin: 13 g/dL (ref 11.1–15.9)
Immature Grans (Abs): 0 10*3/uL (ref 0.0–0.1)
Immature Granulocytes: 0 %
Lymphocytes Absolute: 2.6 10*3/uL (ref 0.7–3.1)
Lymphs: 23 %
MCH: 27.3 pg (ref 26.6–33.0)
MCHC: 32.6 g/dL (ref 31.5–35.7)
MCV: 84 fL (ref 79–97)
Monocytes Absolute: 0.6 10*3/uL (ref 0.1–0.9)
Monocytes: 6 %
Neutrophils Absolute: 7.8 10*3/uL — ABNORMAL HIGH (ref 1.4–7.0)
Neutrophils: 69 %
Platelets: 342 10*3/uL (ref 150–450)
RBC: 4.76 x10E6/uL (ref 3.77–5.28)
RDW: 13.2 % (ref 11.7–15.4)
WBC: 11.2 10*3/uL — ABNORMAL HIGH (ref 3.4–10.8)

## 2022-12-23 LAB — LIPID PANEL WITH LDL/HDL RATIO
Cholesterol, Total: 207 mg/dL — ABNORMAL HIGH (ref 100–199)
HDL: 32 mg/dL — ABNORMAL LOW (ref >39)
LDL Chol Calc (NIH): 129 mg/dL — ABNORMAL HIGH (ref 0–99)
LDL/HDL Ratio: 4 {ratio} — ABNORMAL HIGH (ref 0.0–3.2)
Triglycerides: 260 mg/dL — ABNORMAL HIGH (ref 0–149)
VLDL Cholesterol Cal: 46 mg/dL — ABNORMAL HIGH (ref 5–40)

## 2022-12-23 LAB — HGB A1C W/O EAG: Hgb A1c MFr Bld: 6 % — ABNORMAL HIGH (ref 4.8–5.6)

## 2022-12-26 LAB — CYTOLOGY - PAP: Diagnosis: NEGATIVE

## 2023-03-08 ENCOUNTER — Ambulatory Visit: Payer: Medicaid Other | Admitting: Advanced Practice Midwife

## 2023-03-08 ENCOUNTER — Encounter: Payer: Self-pay | Admitting: Advanced Practice Midwife

## 2023-03-08 VITALS — BP 128/88 | HR 109 | Ht 64.0 in | Wt 179.7 lb

## 2023-03-08 DIAGNOSIS — Z3009 Encounter for other general counseling and advice on contraception: Secondary | ICD-10-CM

## 2023-03-08 DIAGNOSIS — Z3041 Encounter for surveillance of contraceptive pills: Secondary | ICD-10-CM

## 2023-03-08 MED ORDER — NORETHINDRONE 0.35 MG PO TABS: 1.0000 | ORAL_TABLET | Freq: Every day | ORAL | 11 refills | Status: AC

## 2023-03-08 NOTE — Patient Instructions (Signed)
Copper Intrauterine Device (IUD) What is this medication? COPPER IUD (KOP er EYE YOU DEE) prevents pregnancy. It works by using copper to prevent sperm from reaching the egg (fertilization) without hormones. It is a reversible, long-term contraceptive. This medicine may be used for other purposes; ask your health care provider or pharmacist if you have questions. COMMON BRAND NAME(S): ParaGard T380A What should I tell my care team before I take this medication? They need to know if you have any of these conditions: Abnormal Pap smear Cancer, including leukemia, uterine cancer, cervical cancer Condition of the uterus that changes its shape, such as large fibroid tumors Endometriosis Genital or pelvic infection now or in the past 3 months Have more than one sexual partner or your partner has more than one partner History of an ectopic or tubal pregnancy Immune system problems Intrauterine system already in your uterus Pelvic inflammatory disease (PID) Sexually transmitted infection (STI) Substance abuse disorder Unexplained vaginal bleeding An unusual or allergic reaction to copper, barium sulfate, polyethylene, other medications, foods, dyes, or preservatives Pregnant or trying to get pregnant Breast-feeding How should I use this medication? This device is placed inside the uterus by your care team. A patient package insert for the product will be given each time it is inserted. Be sure to read this information carefully each time. The sheet may change often. Talk to your care team about use of this medication in children. Special care may be needed. Overdosage: If you think you have taken too much of this medicine contact a poison control center or emergency room at once. NOTE: This medicine is only for you. Do not share this medicine with others. What if I miss a dose? This does not apply. The device will need to be replaced every 10 years if you wish to continue using this type of  contraception. What may interact with this medication? Interactions are not expected. This list may not describe all possible interactions. Give your health care provider a list of all the medicines, herbs, non-prescription drugs, or dietary supplements you use. Also tell them if you smoke, drink alcohol, or use illegal drugs. Some items may interact with your medicine. What should I watch for while using this medication? Visit your care team for regular check-ups. Talk to your care team if you wish to become pregnant or think you might be pregnant. The IUD will need to be removed. The IUD does not protect you or your partner against HIV or any other sexually transmitted infections. Tell your care team if you or your partner becomes HIV positive or gets a sexually transmitted infection. You can check the placement of the IUD yourself by reaching up to the top of your vagina with clean fingers to feel the threads. Do not pull on the threads. It is a good habit to check placement after each menstrual period. Call your care team right away if you feel more of the IUD than just the threads or if you cannot feel the threads at all. The IUD may come out by itself. You may become pregnant if the device comes out. If you notice that the IUD has come out use a backup contraceptive method, such as condoms, and call your care team. Using tampons will not change the position of the IUD. It is okay to use tampons during your menstrual period. This IUD can be safely scanned with magnetic resonance imaging (MRI) only under specific conditions. Before you have an MRI, tell your care team that  you have an IUD in place, and which type of IUD you have in place. What side effects may I notice from receiving this medication? Side effects that you should report to your care team as soon as possible: Allergic reactions--skin rash, itching, hives, swelling of the face, lips, tongue, or throat Heavy vaginal bleeding Low red  blood cell level--unusual weakness or fatigue, dizziness, headache, trouble breathing Pelvic inflammatory disease (PID)--fever, abdominal pain, pelvic pain, pain or trouble passing urine, spotting, bleeding during or after sex Seizures Slow heartbeat--dizziness, feeling faint or lightheaded, confusion, trouble breathing, unusual weakness or fatigue Stomach pain, unusual weakness or fatigue, nausea, vomiting, diarrhea, or fever that lasts longer than expected Unusual vaginal discharge, itching, or odor Vaginal pain, irritation, or sores Side effects that usually do not require medical attention (report these to your care team if they continue or are bothersome): Back pain Irregular menstrual cycles or spotting Menstrual cramps Vaginal discharge This list may not describe all possible side effects. Call your doctor for medical advice about side effects. You may report side effects to FDA at 1-800-FDA-1088. Where should I keep my medication? This does not apply. NOTE: This sheet is a summary. It may not cover all possible information. If you have questions about this medicine, talk to your doctor, pharmacist, or health care provider.  2024 Elsevier/Gold Standard (2021-03-11 00:00:00)

## 2023-03-08 NOTE — Progress Notes (Signed)
 Patient ID: Audrey Cruz, female   DOB: [birth year], 28 y.o.   MRN: [id number]  Reason for Visit: birth control consult   Subjective:  HPI:  Audrey Cruz is a 28 y.o. female being seen to discuss a change of birth control. She has been taking Slynd since 2023 and prior to that was on Depo. She reports mood was good while on Depo (she does not want to continue on Depo due to potential associated side effects). Since taking Slynd she is "not as happy". She has been seeing a PCP via [facility] for management of her Prozac. That doctor suggested she try a different Progesterone only product. Today we reviewed the options as well as potential side effects. She is not ready for a LARC and would like to try the progesterone only pill.   She reports generally healthy lifestyle. She may be interested in Paragard copper IUD in the future.  Past Medical History:  Diagnosis Date   Medical history non-contributory    Miscarriage    Family History  Problem Relation Age of Onset   Thyroid disease Mother    Hepatitis C Mother    Healthy Father    Ovarian cancer Maternal Grandmother 28   Lung cancer Maternal Grandfather    Breast cancer Paternal Aunt 57       great aunt   Past Surgical History:  Procedure Laterality Date   CESAREAN SECTION  09/21/2020   Procedure: CESAREAN SECTION;  Surgeon: Vena Austria, MD;  Location: [facility] ORS;  Service: Obstetrics;;   DILATION AND EVACUATION N/A 11/26/2019   Procedure: DILATATION AND EVACUATION;  Surgeon: Natale Milch, MD;  Location: [facility] ORS;  Service: Gynecology;  Laterality: N/A;   INDUCED ABORTION      Short Social History:  Social History   Tobacco Use   Smoking status: Never   Smokeless tobacco: Never  Substance Use Topics   Alcohol use: No    Allergies  Allergen Reactions   Amoxicillin Hives   Penicillins Hives    Current Outpatient Medications  Medication Sig Dispense Refill   FLUoxetine (PROZAC) 10 MG capsule Take 10 mg  by mouth daily.     norethindrone (ORTHO MICRONOR) 0.35 MG tablet Take 1 tablet (0.35 mg total) by mouth daily. 28 tablet 11   No current facility-administered medications for this visit.    Review of Systems  Constitutional:  Negative for chills and fever.  HENT:  Negative for congestion, ear discharge, ear pain, hearing loss, sinus pain and sore throat.   Eyes:  Negative for blurred vision and double vision.  Respiratory:  Negative for cough, shortness of breath and wheezing.   Cardiovascular:  Negative for chest pain, palpitations and leg swelling.  Gastrointestinal:  Negative for abdominal pain, blood in stool, constipation, diarrhea, heartburn, melena, nausea and vomiting.  Genitourinary:  Negative for dysuria, flank pain, frequency, hematuria and urgency.  Musculoskeletal:  Negative for back pain, joint pain and myalgias.  Skin:  Negative for itching and rash.  Neurological:  Negative for dizziness, tingling, tremors, sensory change, speech change, focal weakness, seizures, loss of consciousness, weakness and headaches.  Endo/Heme/Allergies:  Negative for environmental allergies. Does not bruise/bleed easily.  Psychiatric/Behavioral:  Positive for depression. Negative for hallucinations, memory loss, substance abuse and suicidal ideas. The patient is not nervous/anxious and does not have insomnia.         Objective:  Objective   Vitals:   03/08/23 1115  BP: 128/88  Pulse: (!) 109  Weight:  179 lb 11.2 oz (81.5 kg)  Height: 5\' 4"  (1.626 m)   Body mass index is 30.85 kg/m. Constitutional: Well nourished, well developed female in no acute distress.  HEENT: normal Skin: Warm and dry.  Cardiovascular: Regular rate and rhythm.   Extremity: no edema Respiratory:  Normal respiratory effort Psych: Alert and Oriented x3. No memory deficits. Normal mood and affect.     Assessment/Plan:     28 y.o. G2 P41 female birth control management  Discontinue Slynd Rx Micronor:  allow for adjustment time Follow up as needed   Tresea Mall, CNM [facility] 03/08/2023 11:57 AM
